# Patient Record
Sex: Female | Born: 1970
Health system: Southern US, Community
[De-identification: ages and names within clinical notes are randomized; demographics above are authoritative.]

## PROBLEM LIST (undated history)

## (undated) DIAGNOSIS — H9319 Tinnitus, unspecified ear: Secondary | ICD-10-CM

## (undated) DIAGNOSIS — I2699 Other pulmonary embolism without acute cor pulmonale: Secondary | ICD-10-CM

## (undated) DIAGNOSIS — F32A Depression, unspecified: Secondary | ICD-10-CM

## (undated) DIAGNOSIS — L409 Psoriasis, unspecified: Secondary | ICD-10-CM

## (undated) HISTORY — DX: Tinnitus, unspecified ear: H93.19

## (undated) HISTORY — PX: TOE SURGERY: SHX1073

## (undated) HISTORY — DX: Depression, unspecified: F32.A

## (undated) HISTORY — DX: Psoriasis, unspecified: L40.9

## (undated) HISTORY — PX: FOOT SURGERY: SHX648

---

## 1998-10-12 ENCOUNTER — Ambulatory Visit (HOSPITAL_COMMUNITY): Admission: RE | Admit: 1998-10-12 | Discharge: 1998-10-12 | Payer: Self-pay | Admitting: *Deleted

## 1998-10-22 ENCOUNTER — Inpatient Hospital Stay (HOSPITAL_COMMUNITY): Admission: AD | Admit: 1998-10-22 | Discharge: 1998-10-22 | Payer: Self-pay | Admitting: *Deleted

## 1999-02-15 ENCOUNTER — Ambulatory Visit (HOSPITAL_COMMUNITY): Admission: RE | Admit: 1999-02-15 | Discharge: 1999-02-15 | Payer: Self-pay | Admitting: *Deleted

## 1999-02-15 ENCOUNTER — Encounter: Payer: Self-pay | Admitting: *Deleted

## 1999-03-10 ENCOUNTER — Ambulatory Visit (HOSPITAL_COMMUNITY): Admission: RE | Admit: 1999-03-10 | Discharge: 1999-03-10 | Payer: Self-pay | Admitting: *Deleted

## 1999-03-10 ENCOUNTER — Encounter: Payer: Self-pay | Admitting: *Deleted

## 1999-03-27 ENCOUNTER — Inpatient Hospital Stay (HOSPITAL_COMMUNITY): Admission: AD | Admit: 1999-03-27 | Discharge: 1999-03-27 | Payer: Self-pay | Admitting: Obstetrics and Gynecology

## 1999-05-17 ENCOUNTER — Inpatient Hospital Stay (HOSPITAL_COMMUNITY): Admission: AD | Admit: 1999-05-17 | Discharge: 1999-05-19 | Payer: Self-pay | Admitting: Obstetrics and Gynecology

## 1999-05-17 ENCOUNTER — Inpatient Hospital Stay (HOSPITAL_COMMUNITY): Admission: AD | Admit: 1999-05-17 | Discharge: 1999-05-17 | Payer: Self-pay | Admitting: *Deleted

## 1999-05-20 ENCOUNTER — Encounter (HOSPITAL_COMMUNITY): Admission: RE | Admit: 1999-05-20 | Discharge: 1999-08-18 | Payer: Self-pay | Admitting: *Deleted

## 2000-01-25 ENCOUNTER — Other Ambulatory Visit: Admission: RE | Admit: 2000-01-25 | Discharge: 2000-01-25 | Payer: Self-pay | Admitting: Obstetrics and Gynecology

## 2000-04-26 ENCOUNTER — Emergency Department (HOSPITAL_COMMUNITY): Admission: EM | Admit: 2000-04-26 | Discharge: 2000-04-26 | Payer: Self-pay | Admitting: Emergency Medicine

## 2000-12-26 ENCOUNTER — Other Ambulatory Visit: Admission: RE | Admit: 2000-12-26 | Discharge: 2000-12-26 | Payer: Self-pay | Admitting: Obstetrics and Gynecology

## 2001-08-21 ENCOUNTER — Inpatient Hospital Stay (HOSPITAL_COMMUNITY): Admission: EM | Admit: 2001-08-21 | Discharge: 2001-08-22 | Payer: Self-pay | Admitting: Emergency Medicine

## 2001-08-21 ENCOUNTER — Encounter: Payer: Self-pay | Admitting: Emergency Medicine

## 2002-06-11 ENCOUNTER — Ambulatory Visit (HOSPITAL_BASED_OUTPATIENT_CLINIC_OR_DEPARTMENT_OTHER): Admission: RE | Admit: 2002-06-11 | Discharge: 2002-06-11 | Payer: Self-pay | Admitting: Family Medicine

## 2002-12-11 ENCOUNTER — Encounter: Payer: Self-pay | Admitting: Orthopedic Surgery

## 2002-12-12 ENCOUNTER — Inpatient Hospital Stay (HOSPITAL_COMMUNITY): Admission: RE | Admit: 2002-12-12 | Discharge: 2002-12-13 | Payer: Self-pay | Admitting: Orthopedic Surgery

## 2003-03-26 ENCOUNTER — Other Ambulatory Visit: Admission: RE | Admit: 2003-03-26 | Discharge: 2003-03-26 | Payer: Self-pay | Admitting: Obstetrics and Gynecology

## 2003-04-22 ENCOUNTER — Ambulatory Visit (HOSPITAL_BASED_OUTPATIENT_CLINIC_OR_DEPARTMENT_OTHER): Admission: RE | Admit: 2003-04-22 | Discharge: 2003-04-22 | Payer: Self-pay | Admitting: Orthopedic Surgery

## 2005-03-25 ENCOUNTER — Other Ambulatory Visit: Admission: RE | Admit: 2005-03-25 | Discharge: 2005-03-25 | Payer: Self-pay | Admitting: Obstetrics and Gynecology

## 2005-03-28 ENCOUNTER — Ambulatory Visit (HOSPITAL_COMMUNITY): Admission: RE | Admit: 2005-03-28 | Discharge: 2005-03-28 | Payer: Self-pay | Admitting: Family Medicine

## 2005-05-09 ENCOUNTER — Ambulatory Visit (HOSPITAL_COMMUNITY): Admission: RE | Admit: 2005-05-09 | Discharge: 2005-05-09 | Payer: Self-pay | Admitting: Endocrinology

## 2006-01-17 ENCOUNTER — Ambulatory Visit (HOSPITAL_COMMUNITY): Admission: RE | Admit: 2006-01-17 | Discharge: 2006-01-17 | Payer: Self-pay | Admitting: General Surgery

## 2006-09-05 ENCOUNTER — Other Ambulatory Visit: Admission: RE | Admit: 2006-09-05 | Discharge: 2006-09-05 | Payer: Self-pay | Admitting: Obstetrics and Gynecology

## 2006-09-12 ENCOUNTER — Ambulatory Visit (HOSPITAL_COMMUNITY): Admission: RE | Admit: 2006-09-12 | Discharge: 2006-09-12 | Payer: Self-pay | Admitting: Family Medicine

## 2006-12-07 ENCOUNTER — Emergency Department (HOSPITAL_COMMUNITY): Admission: EM | Admit: 2006-12-07 | Discharge: 2006-12-07 | Payer: Self-pay | Admitting: Emergency Medicine

## 2010-01-26 ENCOUNTER — Encounter: Admission: RE | Admit: 2010-01-26 | Discharge: 2010-01-26 | Payer: Self-pay | Admitting: Family Medicine

## 2010-08-29 ENCOUNTER — Emergency Department (HOSPITAL_BASED_OUTPATIENT_CLINIC_OR_DEPARTMENT_OTHER)
Admission: EM | Admit: 2010-08-29 | Discharge: 2010-08-29 | Payer: Self-pay | Source: Home / Self Care | Admitting: Emergency Medicine

## 2010-12-25 ENCOUNTER — Encounter
Admission: RE | Admit: 2010-12-25 | Discharge: 2010-12-25 | Payer: Self-pay | Source: Home / Self Care | Attending: Otolaryngology | Admitting: Otolaryngology

## 2010-12-26 ENCOUNTER — Encounter (HOSPITAL_BASED_OUTPATIENT_CLINIC_OR_DEPARTMENT_OTHER): Payer: Self-pay | Admitting: General Surgery

## 2011-02-17 LAB — COMPREHENSIVE METABOLIC PANEL
ALT: 21 U/L (ref 0–35)
AST: 23 U/L (ref 0–37)
Albumin: 4 g/dL (ref 3.5–5.2)
Alkaline Phosphatase: 87 U/L (ref 39–117)
Glucose, Bld: 99 mg/dL (ref 70–99)
Potassium: 4 mEq/L (ref 3.5–5.1)
Sodium: 142 mEq/L (ref 135–145)
Total Bilirubin: 0.6 mg/dL (ref 0.3–1.2)
Total Protein: 7.8 g/dL (ref 6.0–8.3)

## 2011-02-17 LAB — CBC
HCT: 43.1 % (ref 36.0–46.0)
Hemoglobin: 14.1 g/dL (ref 12.0–15.0)
MCHC: 32.6 g/dL (ref 30.0–36.0)
RDW: 13.2 % (ref 11.5–15.5)

## 2011-02-17 LAB — DIFFERENTIAL
Basophils Relative: 1 % (ref 0–1)
Eosinophils Absolute: 0.2 10*3/uL (ref 0.0–0.7)
Eosinophils Relative: 2 % (ref 0–5)
Lymphocytes Relative: 26 % (ref 12–46)
Monocytes Absolute: 0.5 10*3/uL (ref 0.1–1.0)
Neutro Abs: 5.2 10*3/uL (ref 1.7–7.7)

## 2011-02-17 LAB — URINALYSIS, ROUTINE W REFLEX MICROSCOPIC
Bilirubin Urine: NEGATIVE
Nitrite: NEGATIVE
Specific Gravity, Urine: 1.014 (ref 1.005–1.030)
Urobilinogen, UA: 0.2 mg/dL (ref 0.0–1.0)

## 2011-02-17 LAB — LIPASE, BLOOD: Lipase: 68 U/L (ref 23–300)

## 2011-02-17 LAB — PREGNANCY, URINE: Preg Test, Ur: NEGATIVE

## 2011-04-22 NOTE — Op Note (Signed)
   Angela Owen, Angela Owen                        ACCOUNT NO.:  [id number]   MEDICAL RECORD NO.:  [id number]                   PATIENT TYPE:  AMB   LOCATION:  DSC                                  FACILITY:  [facility]   PHYSICIAN:  Leonides Grills, M.D.                  DATE OF BIRTH:  01/01/1971   DATE OF PROCEDURE:  04/22/2003  DATE OF DISCHARGE:                                 OPERATIVE REPORT   PREOPERATIVE DIAGNOSIS:  Complication of hardware, left deep calcaneus.   POSTOPERATIVE DIAGNOSIS:  Complication of hardware, left deep calcaneus.   PROCEDURE:  Hardware removal, deep left calcaneus.   ANESTHESIA:  General endotracheal tube.   SURGEON:  Leonides Grills, M.D.   ASSISTANT:  Lianne Cure, P.A.   ESTIMATED BLOOD LOSS:  Minimal.   TOURNIQUET:  None.   COMPLICATIONS:  None.   DISPOSITION:  Stable to PAR.   INDICATIONS:  This is a 40 year old female who is status post a left  hindfoot reconstructive procedure with symptomatic hardware.  She was  consented for the above procedure.  All risks, which include infection,  nerve or vessel injury, bleeding, wound breakdown, persistent pain,  worsening pain, were all explained, questions were encouraged and answered.   DESCRIPTION OF PROCEDURE:  The patient was brought to the operating room and  placed in supine position after adequate general endotracheal tube  anesthesia was administered as well as Ancef 1 g IV piggyback.  The left  lower extremity was then prepped and draped in a sterile manner.  A  longitudinal incision over the previous incision was then made.  Dissection  was carried down to the screw heads.  The screws were then removed with a  large-fragment screwdriver without difficulty.  All screws were intact, no  evidence of failure.  The wound was copiously irrigated with normal saline.  Skin was closed with 4-0 nylon.  A sterile dressing was applied.  The  patient was taken to the PAR.                     Leonides Grills, M.D.    PB/MEDQ  D:  04/22/2003  T:  04/22/2003  Job:  [id number]

## 2011-04-22 NOTE — Op Note (Signed)
Angela Owen, Angela Owen                        ACCOUNT NO.:  [id number]   MEDICAL RECORD NO.:  [id number]                   PATIENT TYPE:  AMB   LOCATION:  DFTL                                 FACILITY:  [facility]   PHYSICIAN:  Leonides Grills, M.D.                  DATE OF BIRTH:  1971/08/03   DATE OF PROCEDURE:  12/11/2002  DATE OF DISCHARGE:                                 OPERATIVE REPORT   PREOPERATIVE DIAGNOSES:  1. Large grade 2 posterior tibial tendon insufficiency/tendonitis.  2. Left tight Achilles tendon.   POSTOPERATIVE DIAGNOSIS:  1. Large grade 2 posterior tibial tendon insufficiency/tendonitis.  2. Left tight Achilles tendon.   OPERATION:  1. Left lengthening calcaneal osteotomy.  2. Left iliac crest bone graft.  3. Left medializing calcaneal osteotomy.  4. Left percutaneous tendo Achilles lengthening.  5. Left tenosynovectomy, posterior tibial tendon.  6. Left FDL to posterior tibial tendon transfer.   ANESTHESIA:  General endotracheal tube anesthesia.   SURGEON:  Leonides Grills, M.D.   ASSISTANT:  Lianne Cure, P.A.   ESTIMATED BLOOD LOSS:  Minimal.   TOURNIQUET TIME:  1 hour and 10 minutes.   COMPLICATIONS:  None.   DISPOSITION:  Stable to the PR.   INDICATIONS:  This is a 40 year old pleasant female who has had longstanding  left posterior medial ankle pain that has been resistant to conservative  management. She is consented for the above procedure. All risks which  include infection, neurovascular injury, malunion, nonunion, hardware  irritation or hardware failure, DVT, possible PE, stiffness, arthritis,  recurrence of deformity, persistent pain and worsening of pain were all  explained and questions were encouraged and answered.   OPERATION:  The patient was brought to the operating room and placed in the  supine position. After adequate general endotracheal tube anesthesia was  administered as well as 1 gm Ancef IV. The left lower extremity as  well as  the left iliac crest bone graft site were prepped and draped in a sterile  manner with a proximally placed thigh tourniquet.   The procedure commenced with harvesting the iliac crest bone graft. A  longitudinal incision over the left iliac crest was made. Dissection was  carried down through adipose tissue to the crest. Hemostasis was obtained.  The crest was opened in line with the incision. The soft tissues were  elevated off the inner and outer table.   An approximately 12 mm long trapezoidal shaped tricortical bone block was  then obtained with a saggital saw and the aid of a curved 1/4 inch  osteotome. Once this was removed the wound was irrigated copiously with  normal saline. Bone wax was placed in the open bone. Marcaine 0.5% without  epinephrine was instilled in the wound. The wound was closed in layers with  #1 Vicryl deep and 2-0 Vicryl subcu and 4-0 Monocryl subcuticular stitch and  Steri-Strips were applied.  We then performed a percutaneous teno Achilles lengthening with 2 medial and  1 lateral hemisections of the Achilles tendon and this had an excellent  release of the tight Achilles tendon. We then gravity exsanguinated the left  lower extremity and the tourniquet was elevated to 290 mmHg.   We started the procedure with a longitudinal incision over the posterior  tibial tendon. Dissection was carried down through the flexor retinaculum.  The flexor retinaculum was opened in line of the incision. There was a  tremendous amount of synovitis in this area. This was debrided sharply with  the scalpel.   Once all the synovitis was removed, the tendon was inspected. There were no  tears within the tendon. It appeared to be without significant disease, and  there was adequate excursion when pulled on it as well and was not rock  solid.   We then identified the FDL tendon and traced this to _______ and tenotomized  this and left it in the wound for later  transfer. We then went on the  lateral aspect of the foot. A longitudinal incision was made over the neck  of the calcaneous. Dissection was carried down through the soft tissues with  the aid of the scissors.   Once the extensor digitorum brevis was identified, this was then elevated  off the neck of the calcaneous as well as soft tissues plantarly. The  peroneal tendons were protected. With Hohmanns in place and the CC joint  identified, an osteotomy was made approximately 1.2 cm proximal to the CC  joint, perpendicular to the lateral wall of the calcaneous, parallel to the  CC joint with the sagittal saw.   Once this was done we then placed a small lamina spreader in this area,  spread and lengthened the calcaneous. This had an excellent correction of  the arch. We then placed the tricortical bone block, tamping it into place  so that it was flush with both the lateral and superior cortex as well. This  was palpated with the Glorious Peach to be in excellent position. We then placed a 50  mm long, 3.5 mm fully threaded cortical screw across this area from the  anterior process of the calcaneous.   Once this was done we then made a longitudinal incision perpendicular to the  calcaneal  pitch over the calcaneal tuber laterally. Dissection was carried  down to the bone. The soft tissues were elevated both superiorly and  inferiorly. Hohmanns were then placed and elevated.  With a 4000 saw, an  osteotomy was then made. Once the osteotomy was made, the calcaneous was  then transferred a minimum of 5 to 7 mm medially, and this was provisionally  held with a 2 mm K-wire.   A longitudinal incision was then made in the midline glabral skin of the  heel. The dissection was carried down to the bone. Two 6.5 mm, 16 mm  threaded cancellous lag screws were then placed, drilling with a 4-5 and 3-5 drills respectively. This had excellent compression of the osteotomy site.  The K-wire was then removed.    Final x-rays were obtained in the AP and lateral and axial views of the hind  foot. This showed excellent position of the hardware and correction of the  hindfoot deformity.   Once all wounds were copiously irrigated with normal saline. The lateral  wounds as well as the heel wound were closed with 4-0 nylon. We then went  back to the medial wound. The FDL  was then transferred to the posterior  tibial tendon as well as the navicular periosteum using #2 fiber wire. The  wound was copiously irrigated with normal saline medially. The subcu was  closed with 2-0 Vicryl. The skin was closed 4-0 nylon. A sterile dressing  was applied. A Jones dressing was applied with the ankle in  neutral  dorsiflexion. The patient was stable to the PR.                                               Leonides Grills, M.D.    PB/MEDQ  D:  12/11/2002  T:  12/11/2002  Job:  [id number]
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
## 2012-02-29 ENCOUNTER — Other Ambulatory Visit: Payer: Self-pay | Admitting: Physician Assistant

## 2012-02-29 ENCOUNTER — Other Ambulatory Visit (HOSPITAL_COMMUNITY)
Admission: RE | Admit: 2012-02-29 | Discharge: 2012-02-29 | Disposition: A | Discharge status: Home / Self Care | Payer: 59 | Source: Ambulatory Visit | Attending: Family Medicine | Admitting: Family Medicine

## 2012-02-29 DIAGNOSIS — Z0000 Encounter for general adult medical examination without abnormal findings: Secondary | ICD-10-CM | POA: Insufficient documentation

## 2015-04-07 ENCOUNTER — Other Ambulatory Visit: Payer: Self-pay | Admitting: Orthopedic Surgery

## 2015-04-07 DIAGNOSIS — M79671 Pain in right foot: Secondary | ICD-10-CM

## 2015-04-13 ENCOUNTER — Ambulatory Visit
Admission: RE | Admit: 2015-04-13 | Discharge: 2015-04-13 | Disposition: A | Discharge status: Home / Self Care | Payer: 59 | Source: Ambulatory Visit | Attending: Orthopedic Surgery | Admitting: Orthopedic Surgery

## 2015-04-13 DIAGNOSIS — M79671 Pain in right foot: Secondary | ICD-10-CM

## 2015-10-14 ENCOUNTER — Emergency Department (HOSPITAL_COMMUNITY)
Admission: EM | Admit: 2015-10-14 | Discharge: 2015-10-14 | Disposition: A | Discharge status: Home / Self Care | Payer: 59 | Attending: Emergency Medicine | Admitting: Emergency Medicine

## 2015-10-14 ENCOUNTER — Encounter (HOSPITAL_COMMUNITY): Payer: Self-pay | Admitting: Emergency Medicine

## 2015-10-14 DIAGNOSIS — Y998 Other external cause status: Secondary | ICD-10-CM | POA: Insufficient documentation

## 2015-10-14 DIAGNOSIS — M546 Pain in thoracic spine: Secondary | ICD-10-CM

## 2015-10-14 DIAGNOSIS — Y92410 Unspecified street and highway as the place of occurrence of the external cause: Secondary | ICD-10-CM | POA: Insufficient documentation

## 2015-10-14 DIAGNOSIS — Y939 Activity, unspecified: Secondary | ICD-10-CM | POA: Insufficient documentation

## 2015-10-14 DIAGNOSIS — S0990XA Unspecified injury of head, initial encounter: Secondary | ICD-10-CM | POA: Insufficient documentation

## 2015-10-14 DIAGNOSIS — S299XXA Unspecified injury of thorax, initial encounter: Secondary | ICD-10-CM | POA: Insufficient documentation

## 2015-10-14 MED ORDER — METHOCARBAMOL 500 MG PO TABS
750.0000 mg | ORAL_TABLET | Freq: Once | ORAL | Status: AC
  Administered 2015-10-14: 750 mg via ORAL
  Filled 2015-10-14: qty 2

## 2015-10-14 MED ORDER — IBUPROFEN 400 MG PO TABS
800.0000 mg | ORAL_TABLET | Freq: Once | ORAL | Status: AC
  Administered 2015-10-14: 800 mg via ORAL
  Filled 2015-10-14: qty 2

## 2015-10-14 MED ORDER — NAPROXEN 500 MG PO TABS: 500.0000 mg | ORAL_TABLET | Freq: Two times a day (BID) | ORAL | Status: DC
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
## 2015-10-14 NOTE — Discharge Instructions (Signed)
Take your medications as prescribed as needed for pain relief. You may also take your home muscle relaxant prescription as prescribed for additional relief. Follow-up with your primary care provider in one week. Return to the emergency department if symptoms worsen or new onset of fever, numbness, tingling, saddle anesthesia, loss of bowel or bladder, weakness.
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
## 2016-05-04 ENCOUNTER — Other Ambulatory Visit (HOSPITAL_COMMUNITY)
Admission: RE | Admit: 2016-05-04 | Discharge: 2016-05-04 | Disposition: A | Discharge status: Home / Self Care | Payer: 59 | Source: Ambulatory Visit | Attending: Family Medicine | Admitting: Family Medicine

## 2016-05-04 ENCOUNTER — Other Ambulatory Visit: Payer: Self-pay | Admitting: Family Medicine

## 2016-05-04 DIAGNOSIS — Z01419 Encounter for gynecological examination (general) (routine) without abnormal findings: Secondary | ICD-10-CM | POA: Insufficient documentation

## 2016-05-05 LAB — CYTOLOGY - PAP

## 2016-08-09 ENCOUNTER — Encounter (HOSPITAL_COMMUNITY): Payer: Self-pay | Admitting: *Deleted

## 2016-08-09 ENCOUNTER — Emergency Department (HOSPITAL_COMMUNITY)
Admission: EM | Admit: 2016-08-09 | Discharge: 2016-08-09 | Disposition: A | Discharge status: Home / Self Care | Payer: 59 | Attending: Emergency Medicine | Admitting: Emergency Medicine

## 2016-08-09 DIAGNOSIS — Y92410 Unspecified street and highway as the place of occurrence of the external cause: Secondary | ICD-10-CM | POA: Diagnosis not present

## 2016-08-09 DIAGNOSIS — M25552 Pain in left hip: Secondary | ICD-10-CM | POA: Insufficient documentation

## 2016-08-09 DIAGNOSIS — Y939 Activity, unspecified: Secondary | ICD-10-CM | POA: Insufficient documentation

## 2016-08-09 DIAGNOSIS — Y999 Unspecified external cause status: Secondary | ICD-10-CM | POA: Diagnosis not present

## 2016-08-09 MED ORDER — KETOROLAC TROMETHAMINE 30 MG/ML IJ SOLN
30.0000 mg | Freq: Once | INTRAMUSCULAR | Status: AC
  Administered 2016-08-09: 30 mg via INTRAMUSCULAR
  Filled 2016-08-09: qty 1

## 2016-08-09 MED ORDER — PREDNISONE 20 MG PO TABS: 40.0000 mg | ORAL_TABLET | Freq: Every day | ORAL | 0 refills | Status: DC

## 2016-08-09 MED ORDER — HYDROCODONE-ACETAMINOPHEN 5-325 MG PO TABS: 1.0000 | ORAL_TABLET | ORAL | 0 refills | Status: DC | PRN

## 2016-08-09 MED ORDER — PREDNISONE 20 MG PO TABS
40.0000 mg | ORAL_TABLET | Freq: Once | ORAL | Status: AC
  Administered 2016-08-09: 40 mg via ORAL
  Filled 2016-08-09: qty 2

## 2016-08-09 MED ORDER — NAPROXEN 500 MG PO TABS: 500.0000 mg | ORAL_TABLET | Freq: Two times a day (BID) | ORAL | 0 refills | Status: DC

## 2016-08-09 MED ORDER — HYDROCODONE-ACETAMINOPHEN 5-325 MG PO TABS
1.0000 | ORAL_TABLET | Freq: Once | ORAL | Status: AC
  Administered 2016-08-09: 1 via ORAL
  Filled 2016-08-09: qty 1

## 2016-08-09 MED ORDER — METHOCARBAMOL 500 MG PO TABS: 500.0000 mg | ORAL_TABLET | Freq: Two times a day (BID) | ORAL | 0 refills | Status: DC
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
## 2016-08-09 NOTE — Discharge Instructions (Signed)
Take medication as prescribed. Take the prednisone (steroid) and the naproxen at separate times. Follow up with Dr. Alvan Dame as soon as possible. Return to the ER for new or worsening symptoms.
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
## 2017-01-14 DIAGNOSIS — R1084 Generalized abdominal pain: Secondary | ICD-10-CM | POA: Diagnosis not present

## 2017-01-24 DIAGNOSIS — M50121 Cervical disc disorder at C4-C5 level with radiculopathy: Secondary | ICD-10-CM | POA: Diagnosis not present

## 2017-01-24 DIAGNOSIS — M542 Cervicalgia: Secondary | ICD-10-CM | POA: Diagnosis not present

## 2017-02-07 DIAGNOSIS — M79604 Pain in right leg: Secondary | ICD-10-CM | POA: Diagnosis not present

## 2017-02-09 ENCOUNTER — Other Ambulatory Visit: Payer: Self-pay | Admitting: Family Medicine

## 2017-02-09 DIAGNOSIS — M79604 Pain in right leg: Secondary | ICD-10-CM

## 2017-02-13 DIAGNOSIS — M7711 Lateral epicondylitis, right elbow: Secondary | ICD-10-CM | POA: Diagnosis not present

## 2017-02-13 DIAGNOSIS — M25521 Pain in right elbow: Secondary | ICD-10-CM | POA: Diagnosis not present

## 2017-02-13 DIAGNOSIS — M25522 Pain in left elbow: Secondary | ICD-10-CM | POA: Diagnosis not present

## 2017-02-17 DIAGNOSIS — S76012D Strain of muscle, fascia and tendon of left hip, subsequent encounter: Secondary | ICD-10-CM | POA: Diagnosis not present

## 2017-02-17 DIAGNOSIS — M25552 Pain in left hip: Secondary | ICD-10-CM | POA: Diagnosis not present

## 2017-02-20 ENCOUNTER — Ambulatory Visit
Admission: RE | Admit: 2017-02-20 | Discharge: 2017-02-20 | Disposition: A | Discharge status: Home / Self Care | Payer: 59 | Source: Ambulatory Visit | Attending: Family Medicine | Admitting: Family Medicine

## 2017-02-20 DIAGNOSIS — M25522 Pain in left elbow: Secondary | ICD-10-CM | POA: Diagnosis not present

## 2017-02-20 DIAGNOSIS — M25521 Pain in right elbow: Secondary | ICD-10-CM | POA: Diagnosis not present

## 2017-02-20 DIAGNOSIS — M79661 Pain in right lower leg: Secondary | ICD-10-CM | POA: Diagnosis not present

## 2017-02-20 DIAGNOSIS — M79604 Pain in right leg: Secondary | ICD-10-CM

## 2017-03-02 DIAGNOSIS — M25521 Pain in right elbow: Secondary | ICD-10-CM | POA: Diagnosis not present

## 2017-03-02 DIAGNOSIS — M25522 Pain in left elbow: Secondary | ICD-10-CM | POA: Diagnosis not present

## 2017-03-09 DIAGNOSIS — M25522 Pain in left elbow: Secondary | ICD-10-CM | POA: Diagnosis not present

## 2017-03-09 DIAGNOSIS — M25521 Pain in right elbow: Secondary | ICD-10-CM | POA: Diagnosis not present

## 2017-03-16 DIAGNOSIS — M7712 Lateral epicondylitis, left elbow: Secondary | ICD-10-CM | POA: Diagnosis not present

## 2017-03-16 DIAGNOSIS — M7711 Lateral epicondylitis, right elbow: Secondary | ICD-10-CM | POA: Diagnosis not present

## 2017-03-17 DIAGNOSIS — M1612 Unilateral primary osteoarthritis, left hip: Secondary | ICD-10-CM | POA: Diagnosis not present

## 2017-03-17 DIAGNOSIS — M5136 Other intervertebral disc degeneration, lumbar region: Secondary | ICD-10-CM | POA: Diagnosis not present

## 2017-03-17 DIAGNOSIS — M25552 Pain in left hip: Secondary | ICD-10-CM | POA: Diagnosis not present

## 2017-03-17 DIAGNOSIS — M47816 Spondylosis without myelopathy or radiculopathy, lumbar region: Secondary | ICD-10-CM | POA: Diagnosis not present

## 2017-03-20 DIAGNOSIS — R1031 Right lower quadrant pain: Secondary | ICD-10-CM | POA: Diagnosis not present

## 2017-03-21 ENCOUNTER — Other Ambulatory Visit: Payer: Self-pay | Admitting: Psychiatry

## 2017-03-21 DIAGNOSIS — R229 Localized swelling, mass and lump, unspecified: Principal | ICD-10-CM

## 2017-03-21 DIAGNOSIS — IMO0002 Reserved for concepts with insufficient information to code with codable children: Secondary | ICD-10-CM

## 2017-03-22 ENCOUNTER — Other Ambulatory Visit: Payer: Self-pay | Admitting: Physician Assistant

## 2017-03-22 DIAGNOSIS — R1031 Right lower quadrant pain: Secondary | ICD-10-CM

## 2017-03-23 DIAGNOSIS — M7712 Lateral epicondylitis, left elbow: Secondary | ICD-10-CM | POA: Diagnosis not present

## 2017-03-23 DIAGNOSIS — M7711 Lateral epicondylitis, right elbow: Secondary | ICD-10-CM | POA: Diagnosis not present

## 2017-03-31 ENCOUNTER — Other Ambulatory Visit: Payer: 59

## 2017-04-06 ENCOUNTER — Ambulatory Visit
Admission: RE | Admit: 2017-04-06 | Discharge: 2017-04-06 | Disposition: A | Discharge status: Home / Self Care | Payer: 59 | Source: Ambulatory Visit | Attending: Physician Assistant | Admitting: Physician Assistant

## 2017-04-06 DIAGNOSIS — R1031 Right lower quadrant pain: Secondary | ICD-10-CM

## 2017-04-12 DIAGNOSIS — M7711 Lateral epicondylitis, right elbow: Secondary | ICD-10-CM | POA: Diagnosis not present

## 2017-04-12 DIAGNOSIS — M7712 Lateral epicondylitis, left elbow: Secondary | ICD-10-CM | POA: Diagnosis not present

## 2017-04-17 DIAGNOSIS — M542 Cervicalgia: Secondary | ICD-10-CM | POA: Diagnosis not present

## 2017-04-25 DIAGNOSIS — M25552 Pain in left hip: Secondary | ICD-10-CM | POA: Diagnosis not present

## 2017-04-26 DIAGNOSIS — M542 Cervicalgia: Secondary | ICD-10-CM | POA: Diagnosis not present

## 2017-04-28 DIAGNOSIS — M542 Cervicalgia: Secondary | ICD-10-CM | POA: Diagnosis not present

## 2017-05-09 DIAGNOSIS — M542 Cervicalgia: Secondary | ICD-10-CM | POA: Diagnosis not present

## 2017-05-10 DIAGNOSIS — M7711 Lateral epicondylitis, right elbow: Secondary | ICD-10-CM | POA: Diagnosis not present

## 2017-05-10 DIAGNOSIS — M7712 Lateral epicondylitis, left elbow: Secondary | ICD-10-CM | POA: Diagnosis not present

## 2017-05-12 DIAGNOSIS — R1031 Right lower quadrant pain: Secondary | ICD-10-CM | POA: Diagnosis not present

## 2017-05-23 DIAGNOSIS — M25552 Pain in left hip: Secondary | ICD-10-CM | POA: Diagnosis not present

## 2017-05-25 ENCOUNTER — Other Ambulatory Visit: Payer: Self-pay | Admitting: Physician Assistant

## 2017-05-25 DIAGNOSIS — R140 Abdominal distension (gaseous): Secondary | ICD-10-CM

## 2017-05-25 DIAGNOSIS — R1031 Right lower quadrant pain: Secondary | ICD-10-CM

## 2017-06-22 ENCOUNTER — Other Ambulatory Visit: Payer: 59

## 2017-06-27 ENCOUNTER — Ambulatory Visit
Admission: RE | Admit: 2017-06-27 | Discharge: 2017-06-27 | Disposition: A | Discharge status: Home / Self Care | Payer: 59 | Source: Ambulatory Visit | Attending: Physician Assistant | Admitting: Physician Assistant

## 2017-06-27 DIAGNOSIS — R140 Abdominal distension (gaseous): Secondary | ICD-10-CM

## 2017-06-27 DIAGNOSIS — K8020 Calculus of gallbladder without cholecystitis without obstruction: Secondary | ICD-10-CM | POA: Diagnosis not present

## 2017-06-27 DIAGNOSIS — R1031 Right lower quadrant pain: Secondary | ICD-10-CM

## 2017-07-18 DIAGNOSIS — M76892 Other specified enthesopathies of left lower limb, excluding foot: Secondary | ICD-10-CM | POA: Diagnosis not present

## 2017-07-28 DIAGNOSIS — K8020 Calculus of gallbladder without cholecystitis without obstruction: Secondary | ICD-10-CM | POA: Diagnosis not present

## 2017-08-04 DIAGNOSIS — M76892 Other specified enthesopathies of left lower limb, excluding foot: Secondary | ICD-10-CM | POA: Diagnosis not present

## 2017-08-04 DIAGNOSIS — M25552 Pain in left hip: Secondary | ICD-10-CM | POA: Diagnosis not present

## 2017-08-23 DIAGNOSIS — M542 Cervicalgia: Secondary | ICD-10-CM | POA: Diagnosis not present

## 2017-08-23 DIAGNOSIS — R51 Headache: Secondary | ICD-10-CM | POA: Diagnosis not present

## 2017-08-23 DIAGNOSIS — M50121 Cervical disc disorder at C4-C5 level with radiculopathy: Secondary | ICD-10-CM | POA: Diagnosis not present

## 2017-08-30 DIAGNOSIS — Z0189 Encounter for other specified special examinations: Secondary | ICD-10-CM | POA: Diagnosis not present

## 2017-09-04 HISTORY — PX: CHOLECYSTECTOMY: SHX55

## 2017-09-08 ENCOUNTER — Other Ambulatory Visit: Payer: Self-pay | Admitting: General Surgery

## 2017-09-08 DIAGNOSIS — K8010 Calculus of gallbladder with chronic cholecystitis without obstruction: Secondary | ICD-10-CM | POA: Diagnosis not present

## 2017-09-08 DIAGNOSIS — K9172 Accidental puncture and laceration of a digestive system organ or structure during other procedure: Secondary | ICD-10-CM | POA: Diagnosis not present

## 2017-09-20 DIAGNOSIS — R109 Unspecified abdominal pain: Secondary | ICD-10-CM | POA: Diagnosis not present

## 2017-09-27 DIAGNOSIS — M24152 Other articular cartilage disorders, left hip: Secondary | ICD-10-CM | POA: Diagnosis not present

## 2017-09-27 DIAGNOSIS — S73192A Other sprain of left hip, initial encounter: Secondary | ICD-10-CM | POA: Diagnosis not present

## 2017-09-27 DIAGNOSIS — M25552 Pain in left hip: Secondary | ICD-10-CM | POA: Diagnosis not present

## 2017-09-27 DIAGNOSIS — M94252 Chondromalacia, left hip: Secondary | ICD-10-CM | POA: Diagnosis not present

## 2017-09-27 DIAGNOSIS — M76892 Other specified enthesopathies of left lower limb, excluding foot: Secondary | ICD-10-CM | POA: Diagnosis not present

## 2017-09-27 DIAGNOSIS — M25852 Other specified joint disorders, left hip: Secondary | ICD-10-CM | POA: Diagnosis not present

## 2017-09-29 DIAGNOSIS — M25852 Other specified joint disorders, left hip: Secondary | ICD-10-CM | POA: Diagnosis not present

## 2017-09-29 DIAGNOSIS — M76892 Other specified enthesopathies of left lower limb, excluding foot: Secondary | ICD-10-CM | POA: Diagnosis not present

## 2017-09-29 DIAGNOSIS — S73192A Other sprain of left hip, initial encounter: Secondary | ICD-10-CM | POA: Diagnosis not present

## 2017-10-05 HISTORY — PX: HIP SURGERY: SHX245

## 2017-10-06 DIAGNOSIS — M25652 Stiffness of left hip, not elsewhere classified: Secondary | ICD-10-CM | POA: Diagnosis not present

## 2017-10-10 DIAGNOSIS — M25652 Stiffness of left hip, not elsewhere classified: Secondary | ICD-10-CM | POA: Diagnosis not present

## 2017-10-13 DIAGNOSIS — M25652 Stiffness of left hip, not elsewhere classified: Secondary | ICD-10-CM | POA: Diagnosis not present

## 2017-10-16 DIAGNOSIS — M25652 Stiffness of left hip, not elsewhere classified: Secondary | ICD-10-CM | POA: Diagnosis not present

## 2017-10-18 DIAGNOSIS — M25852 Other specified joint disorders, left hip: Secondary | ICD-10-CM | POA: Diagnosis not present

## 2017-10-18 DIAGNOSIS — M76892 Other specified enthesopathies of left lower limb, excluding foot: Secondary | ICD-10-CM | POA: Diagnosis not present

## 2017-10-18 DIAGNOSIS — S73192A Other sprain of left hip, initial encounter: Secondary | ICD-10-CM | POA: Diagnosis not present

## 2017-10-19 DIAGNOSIS — M25652 Stiffness of left hip, not elsewhere classified: Secondary | ICD-10-CM | POA: Diagnosis not present

## 2017-10-23 DIAGNOSIS — M25652 Stiffness of left hip, not elsewhere classified: Secondary | ICD-10-CM | POA: Diagnosis not present

## 2017-10-25 DIAGNOSIS — M25652 Stiffness of left hip, not elsewhere classified: Secondary | ICD-10-CM | POA: Diagnosis not present

## 2017-10-27 ENCOUNTER — Inpatient Hospital Stay (HOSPITAL_COMMUNITY)
Admission: EM | Admit: 2017-10-27 | Discharge: 2017-10-30 | DRG: 176 | Disposition: A | Discharge status: Home / Self Care | Payer: 59 | Attending: Internal Medicine | Admitting: Internal Medicine

## 2017-10-27 ENCOUNTER — Emergency Department (HOSPITAL_COMMUNITY): Payer: 59

## 2017-10-27 ENCOUNTER — Encounter (HOSPITAL_COMMUNITY): Payer: Self-pay

## 2017-10-27 DIAGNOSIS — K219 Gastro-esophageal reflux disease without esophagitis: Secondary | ICD-10-CM | POA: Diagnosis present

## 2017-10-27 DIAGNOSIS — N182 Chronic kidney disease, stage 2 (mild): Secondary | ICD-10-CM | POA: Diagnosis present

## 2017-10-27 DIAGNOSIS — R7989 Other specified abnormal findings of blood chemistry: Secondary | ICD-10-CM | POA: Diagnosis not present

## 2017-10-27 DIAGNOSIS — Z6836 Body mass index (BMI) 36.0-36.9, adult: Secondary | ICD-10-CM

## 2017-10-27 DIAGNOSIS — R079 Chest pain, unspecified: Secondary | ICD-10-CM | POA: Diagnosis not present

## 2017-10-27 DIAGNOSIS — M79605 Pain in left leg: Secondary | ICD-10-CM | POA: Diagnosis not present

## 2017-10-27 DIAGNOSIS — Z9114 Patient's other noncompliance with medication regimen: Secondary | ICD-10-CM

## 2017-10-27 DIAGNOSIS — R0602 Shortness of breath: Secondary | ICD-10-CM | POA: Diagnosis not present

## 2017-10-27 DIAGNOSIS — I82433 Acute embolism and thrombosis of popliteal vein, bilateral: Secondary | ICD-10-CM | POA: Diagnosis present

## 2017-10-27 DIAGNOSIS — Z7982 Long term (current) use of aspirin: Secondary | ICD-10-CM

## 2017-10-27 DIAGNOSIS — Z9049 Acquired absence of other specified parts of digestive tract: Secondary | ICD-10-CM

## 2017-10-27 DIAGNOSIS — I2609 Other pulmonary embolism with acute cor pulmonale: Secondary | ICD-10-CM | POA: Diagnosis not present

## 2017-10-27 DIAGNOSIS — I2699 Other pulmonary embolism without acute cor pulmonale: Secondary | ICD-10-CM | POA: Diagnosis present

## 2017-10-27 DIAGNOSIS — R778 Other specified abnormalities of plasma proteins: Secondary | ICD-10-CM

## 2017-10-27 LAB — I-STAT TROPONIN, ED: Troponin i, poc: 0.13 ng/mL (ref 0.00–0.08)

## 2017-10-27 LAB — BASIC METABOLIC PANEL
ANION GAP: 8 (ref 5–15)
BUN: 11 mg/dL (ref 6–20)
CO2: 26 mmol/L (ref 22–32)
Calcium: 9.4 mg/dL (ref 8.9–10.3)
Chloride: 103 mmol/L (ref 101–111)
Creatinine, Ser: 1.29 mg/dL — ABNORMAL HIGH (ref 0.44–1.00)
GFR, EST AFRICAN AMERICAN: 56 mL/min — AB (ref >60)
GFR, EST NON AFRICAN AMERICAN: 48 mL/min — AB (ref >60)
Glucose, Bld: 101 mg/dL — ABNORMAL HIGH (ref 65–99)
POTASSIUM: 3.6 mmol/L (ref 3.5–5.1)
SODIUM: 137 mmol/L (ref 135–145)

## 2017-10-27 LAB — CBC
HEMATOCRIT: 40.7 % (ref 36.0–46.0)
Hemoglobin: 13.7 g/dL (ref 12.0–15.0)
MCH: 28.4 pg (ref 26.0–34.0)
MCHC: 33.7 g/dL (ref 30.0–36.0)
MCV: 84.3 fL (ref 78.0–100.0)
Platelets: 229 10*3/uL (ref 150–400)
RBC: 4.83 MIL/uL (ref 3.87–5.11)
RDW: 14.1 % (ref 11.5–15.5)
WBC: 7.8 10*3/uL (ref 4.0–10.5)

## 2017-10-27 LAB — I-STAT BETA HCG BLOOD, ED (MC, WL, AP ONLY)

## 2017-10-27 MED ORDER — IOPAMIDOL (ISOVUE-370) INJECTION 76%
INTRAVENOUS | Status: AC
  Filled 2017-10-27: qty 100

## 2017-10-27 MED ORDER — IOPAMIDOL (ISOVUE-370) INJECTION 76%
100.0000 mL | Freq: Once | INTRAVENOUS | Status: AC | PRN
  Administered 2017-10-27: 100 mL via INTRAVENOUS

## 2017-10-27 MED ORDER — HEPARIN (PORCINE) IN NACL 100-0.45 UNIT/ML-% IJ SOLN
1550.0000 [IU]/h | INTRAMUSCULAR | Status: DC
  Administered 2017-10-28: 1550 [IU]/h via INTRAVENOUS
  Filled 2017-10-27: qty 250

## 2017-10-27 MED ORDER — SODIUM CHLORIDE 0.9 % IV BOLUS (SEPSIS)
500.0000 mL | Freq: Once | INTRAVENOUS | Status: AC
  Administered 2017-10-27: 500 mL via INTRAVENOUS

## 2017-10-27 MED ORDER — HEPARIN BOLUS VIA INFUSION
3000.0000 [IU] | Freq: Once | INTRAVENOUS | Status: AC
  Administered 2017-10-28: 3000 [IU] via INTRAVENOUS
  Filled 2017-10-27: qty 3000

## 2017-10-27 NOTE — ED Triage Notes (Signed)
Pt presents with c/o chest pain and left leg pain. Pt reports that she has had 2 surgeries since October 5th. Pt reports that she took a muscle relaxer today as well as some aspirin. Pt reports that she is having lower left leg pain and chest pain and was told to come here to rule out a blood clot.

## 2017-10-27 NOTE — ED Notes (Signed)
Patient in CT

## 2017-10-27 NOTE — ED Provider Notes (Signed)
[facility] DEPT Provider Note   CSN: [id number] Arrival date & time: 10/27/17  2111     History   Chief Complaint Chief Complaint  Patient presents with  . Leg Pain  . Chest Pain    HPI Angela Owen is a 46 y.o. female.  Angela Owen is a 46 y.o. Female who presents to the ED complaining of chest pain and shortness of breath beginning today as well as left leg pain for the past week.  Patient reports she has had about 1 week of increased left calf pain.  She had a recent left hip arthroplasty for a labral tear about 1 month ago.  She reports she has some residual left leg swelling since the surgery. She also reports she recently had a cholecystectomy about a month and a half ago.  She is concerned she might have a blood clot.  She reports today she developed some chest pain this morning.  She took a full strength aspirin earlier today.  She reports substernal chest pain that is nonradiating with associated shortness of breath with exertion.  She reports taking an antacid which helps some with her pain today.  She denies a personal history of MI, DVT or PE.  No exogenous estrogen use.  She does report her father had an MI at the age of 66.  She is not a smoker. She denies fevers, coughing, hemoptysis, abdominal pain, nausea, vomiting, diarrhea, lightheadedness, dizziness, syncope, or rashes.    The history is provided by the patient, medical records and the spouse. No language interpreter was used.  Leg Pain    Chest Pain   Associated symptoms include leg pain and shortness of breath. Pertinent negatives include no abdominal pain, no back pain, no cough, no fever, no headaches, no nausea, no palpitations and no vomiting.    History reviewed. No pertinent past medical history.  There are no active problems to display for this patient.   Past Surgical History:  Procedure Laterality Date  . FOOT SURGERY Left   . TOE SURGERY Right     OB  History    Gravida Para Term Preterm AB Living   1             SAB TAB Ectopic Multiple Live Births                   Home Medications    Prior to Admission medications   Medication Sig Start Date End Date Taking? Authorizing Provider  [facility] ASPIRIN EC 325 MG EC tablet Take 325 mg by mouth 2 (two) times daily. 09/27/17  Yes [provider]  metaxalone (SKELAXIN) 800 MG tablet Take 800 mg by mouth 2 (two) times daily as needed. 02/21/17  Yes [provider]  naproxen (NAPROSYN) 500 MG tablet Take 1 tablet (500 mg total) by mouth 2 (two) times daily. Patient taking differently: Take 500 mg by mouth 2 (two) times daily as needed for mild pain or moderate pain.  10/14/15  Yes Nona Dell, PA-C  ranitidine (ZANTAC) 150 MG tablet Take 150 mg by mouth daily.   Yes [provider]    Family History History reviewed. No pertinent family history.  Social History Social History   Tobacco Use  . Smoking status: Never Smoker  . Smokeless tobacco: Never Used  Substance Use Topics  . Alcohol use: Yes    Comment: opcc  . Drug use: No     Allergies  Patient has no known allergies.   Review of Systems Review of Systems  Constitutional: Negative for chills and fever.  HENT: Negative for congestion and sore throat.   Eyes: Negative for visual disturbance.  Respiratory: Positive for shortness of breath. Negative for cough and wheezing.   Cardiovascular: Positive for chest pain and leg swelling. Negative for palpitations.  Gastrointestinal: Negative for abdominal pain, diarrhea, nausea and vomiting.  Genitourinary: Negative for dysuria.  Musculoskeletal: Positive for arthralgias. Negative for back pain and neck pain.  Skin: Negative for rash.  Neurological: Negative for syncope, light-headedness and headaches.     Physical Exam Updated Vital Signs BP (!) 140/98   Pulse 94   Temp 98.5 F (36.9 C) (Oral)   Resp (!) 21   Ht 5\' 7"  (1.702 m)    Wt 106.6 kg (235 lb)   LMP 09/08/2017 (Approximate) Comment: HCG < 5 10-27-17  SpO2 99%   Breastfeeding? Unknown Comment: often has irregular periods, not unusual to be late  BMI 36.81 kg/m   Physical Exam  Constitutional: She appears well-developed and well-nourished.  Non-toxic appearance. She does not appear ill. No distress.  HENT:  Head: Normocephalic and atraumatic.  Mouth/Throat: Oropharynx is clear and moist.  Eyes: Conjunctivae are normal. Pupils are equal, round, and reactive to light. Right eye exhibits no discharge. Left eye exhibits no discharge.  Neck: Neck supple. No JVD present.  Cardiovascular: Regular rhythm, normal heart sounds and intact distal pulses. Tachycardia present. Exam reveals no gallop and no friction rub.  No murmur heard. Pulses:      Radial pulses are 2+ on the right side, and 2+ on the left side.       Dorsalis pedis pulses are 2+ on the right side, and 2+ on the left side.       Posterior tibial pulses are 2+ on the right side, and 2+ on the left side.  HR 104 during exam.   Pulmonary/Chest: Effort normal and breath sounds normal. No tachypnea. No respiratory distress. She has no decreased breath sounds. She has no wheezes. She has no rales.  Lungs are clear to ascultation bilaterally. Symmetric chest expansion bilaterally. No increased work of breathing. No rales or rhonchi.    Abdominal: Soft. There is no tenderness.  Well healed laparoscopic scars noted to abdomen.   Musculoskeletal:       Right lower leg: Normal.       Left lower leg: She exhibits edema. She exhibits no tenderness.  Mild edema noted to left leg without calf TTP. No overlying skin changes. No palpable cords.   Lymphadenopathy:    She has no cervical adenopathy.  Neurological: She is alert. Coordination normal.  Skin: Skin is warm and dry. Capillary refill takes less than 2 seconds. No rash noted. She is not diaphoretic. No erythema. No pallor.  Psychiatric: She has a normal mood  and affect. Her behavior is normal.  Nursing note and vitals reviewed.    ED Treatments / Results  Labs (all labs ordered are listed, but only abnormal results are displayed) Labs Reviewed  BASIC METABOLIC PANEL - Abnormal; Notable for the following components:      Result Value   Glucose, Bld 101 (*)    Creatinine, Ser 1.29 (*)    GFR calc non Af Amer 48 (*)    GFR calc Af Amer 56 (*)    All other components within normal limits  I-STAT TROPONIN, ED - Abnormal; Notable for the following components:  Troponin i, poc 0.13 (*)    All other components within normal limits  CBC  PROTIME-INR  APTT  CBC  HEPARIN LEVEL (UNFRACTIONATED)  I-STAT BETA HCG BLOOD, ED ([facility], [facility], [facility] ONLY)    EKG  EKG Interpretation  Date/Time:  Friday October 27 2017 21:23:54 EST Ventricular Rate:  111 PR Interval:    QRS Duration: 85 QT Interval:  328 QTC Calculation: 446 R Axis:   86 Text Interpretation:  Sinus tachycardia Low voltage, precordial leads Baseline wander in lead(s) V5 When compared with ECG of 12/07/2006, No significant change was found Confirmed by Delora Fuel ([id number]) on 10/27/2017 10:59:35 PM       Radiology Dg Chest 2 View  Result Date: 10/27/2017 CLINICAL DATA:  46 year old female with chest pain and increasing shortness of breath since this morning. EXAM: CHEST  2 VIEW COMPARISON:  12/07/2006 FINDINGS: The heart size and mediastinal contours are within normal limits. Both lungs are clear. The visualized skeletal structures are unremarkable. IMPRESSION: No active cardiopulmonary disease. Electronically Signed   By: Kristopher Oppenheim M.D.   On: 10/27/2017 21:46   Ct Angio Chest Pe W/cm &/or Wo Cm  Result Date: 10/28/2017 CLINICAL DATA:  Chest pain and left leg pain. 2 surgeries since October 5th. EXAM: CT ANGIOGRAPHY CHEST WITH CONTRAST TECHNIQUE: Multidetector CT imaging of the chest was performed using the standard protocol during bolus administration of intravenous contrast.  Multiplanar CT image reconstructions and MIPs were obtained to evaluate the vascular anatomy. CONTRAST:  139mL ISOVUE-370 IOPAMIDOL (ISOVUE-370) INJECTION 76% COMPARISON:  None. FINDINGS: Cardiovascular: Good opacification of the central and segmental pulmonary artery's. Multiple filling defects are demonstrated in the distal right main pulmonary artery, bilateral upper lobe pulmonary arteries, and bilateral lower lobe pulmonary arteries. Appearance is consistent with multiple acute pulmonary emboli. RV to LV ratio is 1.4. No contrast reflux into the hepatic veins. Normal heart size. No pericardial effusion. Normal caliber thoracic aorta. Great vessel origins are patent. Mediastinum/Nodes: Esophagus is decompressed. No significant lymphadenopathy in the chest. Lungs/Pleura: Lungs are clear and expanded. No focal consolidation or airspace disease. No pleural effusions. No pneumothorax. Airways are patent. Upper Abdomen: No acute abnormality. Musculoskeletal: No chest wall abnormality. No acute or significant osseous findings. Review of the MIP images confirms the above findings. IMPRESSION: Multiple bilateral acute pulmonary emboli. Positive for acute PE with CT evidence of right heart strain (RV/LV Ratio = 1.4) consistent with at least submassive (intermediate risk) PE. The presence of right heart strain has been associated with an increased risk of morbidity and mortality. Please activate Code PE by paging [phone]. These results were called by telephone at the time of interpretation on 10/28/2017 at 12:17 am to Dr. Waynetta Pean , who verbally acknowledged these results. Electronically Signed   By: Lucienne Capers M.D.   On: 10/28/2017 00:19    Procedures Procedures (including critical care time)  CRITICAL CARE Performed by: Hanley Hays   Total critical care time: 50  minutes  Critical care time was exclusive of separately billable procedures and treating other patients.  Critical  care was necessary to treat or prevent imminent or life-threatening deterioration.  Critical care was time spent personally by me on the following activities: development of treatment plan with patient and/or surrogate as well as nursing, discussions with consultants, evaluation of patient's response to treatment, examination of patient, obtaining history from patient or surrogate, ordering and performing treatments and interventions, ordering and review of laboratory studies, ordering and review of radiographic studies, pulse  oximetry and re-evaluation of patient's condition.   Medications Ordered in ED Medications  heparin ADULT infusion 100 units/mL (25000 units/278mL sodium chloride 0.45%) (1,550 Units/hr Intravenous New Bag/Given 10/28/17 0006)  iopamidol (ISOVUE-370) 76 % injection (not administered)  sodium chloride 0.9 % bolus 500 mL (500 mLs Intravenous New Bag/Given 10/27/17 2340)  iopamidol (ISOVUE-370) 76 % injection 100 mL (100 mLs Intravenous Contrast Given 10/27/17 2351)  heparin bolus via infusion 3,000 Units (3,000 Units Intravenous Bolus from Bag 10/28/17 0008)  fentaNYL (SUBLIMAZE) injection 50 mcg (50 mcg Intravenous Given 10/28/17 0025)     Initial Impression / Assessment and Plan / ED Course  I have reviewed the triage vital signs and the nursing notes.  Pertinent labs & imaging results that were available during my care of the patient were reviewed by me and considered in my medical decision making (see chart for details).    This  is a 46 y.o. Female who presents to the ED complaining of chest pain and shortness of breath beginning today as well as left leg pain for the past week.  Patient reports she has had about 1 week of increased left calf pain.  She had a recent left hip arthroplasty for a labral tear about 1 month ago.  She reports she has some residual left leg swelling since the surgery. She also reports she recently had a cholecystectomy about a month and a half  ago.  She is concerned she might have a blood clot.  She reports today she developed some chest pain this morning.  She took a full strength aspirin earlier today.  She reports substernal chest pain that is nonradiating with associated shortness of breath with exertion. On exam the patient is afebrile and non-toxic appearing. She is mildly tachycardic on arrival and on my exam with a HR of 104.  No tachypnea or hypoxia on my exam. EKG shows a sinus tachycardia without significant change from her last tracing.  No evidence of STEMI. Blood work reveals a mildly elevated troponin is 0.13.  I suspect this is related to pulmonary embolism.  Blood work is remarkable for creatinine of 1.29.  No recent blood work to compare.  CBC is unremarkable. With this elevation of troponin heparin was started prior to CT angiogram resulting.  Discussed starting heparin with the patient and its risks.  Patient agrees with plan to start heparin.  Patient is awaiting CT angiogram of her chest. I advised she is to be on bedrest and needs to stay on the monitor. She agrees. I also informed nursing staff.   CT angiogram of her chest shows multiple bilateral acute pulmonary emboli.  There is CT evidence of right heart strain. Code PE was activated. Patient already on heparin drip. Patient agrees with plan for admission.  I discussed test results.  At reevaluation her heart rate is between 94 and 102 on my exam.  She is not hypoxic or tachypneic.  I consulted with CCM Dr. Jimmy Footman. She feels the patient can be admitted by medicine and she would like them to order an echo.   I consulted with [facility] hospitalist Dr. Lisabeth Devoid who accepted the patient for admission.   This patient was discussed with Dr. Tomi Bamberger who agrees with assessment and plan.   Final Clinical Impressions(s) / ED Diagnoses   Final diagnoses:  Other acute pulmonary embolism with acute cor pulmonale (HCC)  Left leg pain  Elevated troponin    ED Discharge Orders      None  Waynetta Pean, PA-C 10/28/17 [id number]    Dorie Rank, MD 10/30/17 [phone]

## 2017-10-27 NOTE — ED Notes (Signed)
Troponin= 0.13 RN and Provider notified

## 2017-10-28 ENCOUNTER — Inpatient Hospital Stay (HOSPITAL_COMMUNITY): Payer: 59

## 2017-10-28 ENCOUNTER — Other Ambulatory Visit: Payer: Self-pay

## 2017-10-28 DIAGNOSIS — I361 Nonrheumatic tricuspid (valve) insufficiency: Secondary | ICD-10-CM

## 2017-10-28 DIAGNOSIS — N182 Chronic kidney disease, stage 2 (mild): Secondary | ICD-10-CM

## 2017-10-28 DIAGNOSIS — Z6836 Body mass index (BMI) 36.0-36.9, adult: Secondary | ICD-10-CM | POA: Diagnosis not present

## 2017-10-28 DIAGNOSIS — R748 Abnormal levels of other serum enzymes: Secondary | ICD-10-CM | POA: Diagnosis not present

## 2017-10-28 DIAGNOSIS — M79605 Pain in left leg: Secondary | ICD-10-CM | POA: Diagnosis not present

## 2017-10-28 DIAGNOSIS — I82409 Acute embolism and thrombosis of unspecified deep veins of unspecified lower extremity: Secondary | ICD-10-CM | POA: Diagnosis not present

## 2017-10-28 DIAGNOSIS — I2699 Other pulmonary embolism without acute cor pulmonale: Secondary | ICD-10-CM | POA: Diagnosis not present

## 2017-10-28 DIAGNOSIS — I2609 Other pulmonary embolism with acute cor pulmonale: Secondary | ICD-10-CM | POA: Diagnosis not present

## 2017-10-28 DIAGNOSIS — I82403 Acute embolism and thrombosis of unspecified deep veins of lower extremity, bilateral: Secondary | ICD-10-CM

## 2017-10-28 DIAGNOSIS — I82433 Acute embolism and thrombosis of popliteal vein, bilateral: Secondary | ICD-10-CM | POA: Diagnosis not present

## 2017-10-28 DIAGNOSIS — Z9114 Patient's other noncompliance with medication regimen: Secondary | ICD-10-CM | POA: Diagnosis not present

## 2017-10-28 DIAGNOSIS — K219 Gastro-esophageal reflux disease without esophagitis: Secondary | ICD-10-CM | POA: Diagnosis present

## 2017-10-28 DIAGNOSIS — Z7982 Long term (current) use of aspirin: Secondary | ICD-10-CM | POA: Diagnosis not present

## 2017-10-28 DIAGNOSIS — Z9049 Acquired absence of other specified parts of digestive tract: Secondary | ICD-10-CM | POA: Diagnosis not present

## 2017-10-28 LAB — PROTIME-INR
INR: 1.16
Prothrombin Time: 14.8 seconds (ref 11.4–15.2)

## 2017-10-28 LAB — COMPREHENSIVE METABOLIC PANEL
ALT: 15 U/L (ref 14–54)
ANION GAP: 8 (ref 5–15)
AST: 19 U/L (ref 15–41)
Albumin: 3.5 g/dL (ref 3.5–5.0)
Alkaline Phosphatase: 69 U/L (ref 38–126)
BUN: 11 mg/dL (ref 6–20)
CHLORIDE: 105 mmol/L (ref 101–111)
CO2: 25 mmol/L (ref 22–32)
Calcium: 8.7 mg/dL — ABNORMAL LOW (ref 8.9–10.3)
Creatinine, Ser: 1.13 mg/dL — ABNORMAL HIGH (ref 0.44–1.00)
GFR, EST NON AFRICAN AMERICAN: 57 mL/min — AB (ref >60)
Glucose, Bld: 100 mg/dL — ABNORMAL HIGH (ref 65–99)
POTASSIUM: 3.6 mmol/L (ref 3.5–5.1)
SODIUM: 138 mmol/L (ref 135–145)
Total Bilirubin: 0.6 mg/dL (ref 0.3–1.2)
Total Protein: 6.7 g/dL (ref 6.5–8.1)

## 2017-10-28 LAB — URINALYSIS, ROUTINE W REFLEX MICROSCOPIC
BILIRUBIN URINE: NEGATIVE
Glucose, UA: NEGATIVE mg/dL
Hgb urine dipstick: NEGATIVE
KETONES UR: NEGATIVE mg/dL
LEUKOCYTES UA: NEGATIVE
NITRITE: NEGATIVE
PH: 7 (ref 5.0–8.0)
PROTEIN: NEGATIVE mg/dL
Specific Gravity, Urine: 1.019 (ref 1.005–1.030)

## 2017-10-28 LAB — CBC
HCT: 38 % (ref 36.0–46.0)
Hemoglobin: 12.7 g/dL (ref 12.0–15.0)
MCH: 28 pg (ref 26.0–34.0)
MCHC: 33.4 g/dL (ref 30.0–36.0)
MCV: 83.7 fL (ref 78.0–100.0)
PLATELETS: 211 10*3/uL (ref 150–400)
RBC: 4.54 MIL/uL (ref 3.87–5.11)
RDW: 14.1 % (ref 11.5–15.5)
WBC: 7.6 10*3/uL (ref 4.0–10.5)

## 2017-10-28 LAB — ECHOCARDIOGRAM COMPLETE
HEIGHTINCHES: 67 in
Weight: 3760 oz

## 2017-10-28 LAB — TROPONIN I
TROPONIN I: 0.3 ng/mL — AB (ref <0.03)
TROPONIN I: 0.17 ng/mL — AB (ref <0.03)
Troponin I: 0.15 ng/mL (ref <0.03)

## 2017-10-28 LAB — HEPARIN LEVEL (UNFRACTIONATED)
Heparin Unfractionated: 1.01 IU/mL — ABNORMAL HIGH (ref 0.30–0.70)
Heparin Unfractionated: 0.88 IU/mL — ABNORMAL HIGH (ref 0.30–0.70)

## 2017-10-28 LAB — APTT: APTT: 28 s (ref 24–36)

## 2017-10-28 MED ORDER — FAMOTIDINE 20 MG PO TABS
10.0000 mg | ORAL_TABLET | Freq: Every day | ORAL | Status: DC
  Administered 2017-10-28 – 2017-10-30 (×3): 10 mg via ORAL
  Filled 2017-10-28 (×3): qty 1

## 2017-10-28 MED ORDER — FENTANYL CITRATE (PF) 100 MCG/2ML IJ SOLN
50.0000 ug | Freq: Once | INTRAMUSCULAR | Status: AC
  Administered 2017-10-28: 50 ug via INTRAVENOUS
  Filled 2017-10-28: qty 2

## 2017-10-28 MED ORDER — HEPARIN (PORCINE) IN NACL 100-0.45 UNIT/ML-% IJ SOLN
1330.0000 [IU]/h | INTRAMUSCULAR | Status: DC
  Administered 2017-10-28: 1350 [IU]/h via INTRAVENOUS
  Filled 2017-10-28: qty 250

## 2017-10-28 MED ORDER — ENSURE ENLIVE PO LIQD: 237.0000 mL | Freq: Two times a day (BID) | ORAL | Status: DC

## 2017-10-28 MED ORDER — SODIUM CHLORIDE 0.9 % IV SOLN
INTRAVENOUS | Status: DC
  Administered 2017-10-28: 06:00:00 via INTRAVENOUS

## 2017-10-28 MED ORDER — ONDANSETRON HCL 4 MG/2ML IJ SOLN: 4.0000 mg | Freq: Four times a day (QID) | INTRAMUSCULAR | Status: DC | PRN

## 2017-10-28 MED ORDER — METAXALONE 800 MG PO TABS
800.0000 mg | ORAL_TABLET | Freq: Two times a day (BID) | ORAL | Status: DC | PRN
  Filled 2017-10-28: qty 1

## 2017-10-28 MED ORDER — ONDANSETRON HCL 4 MG PO TABS: 4.0000 mg | ORAL_TABLET | Freq: Four times a day (QID) | ORAL | Status: DC | PRN

## 2017-10-28 MED ORDER — HEPARIN (PORCINE) IN NACL 100-0.45 UNIT/ML-% IJ SOLN
1150.0000 [IU]/h | INTRAMUSCULAR | Status: DC
  Administered 2017-10-29 – 2017-10-30 (×2): 1150 [IU]/h via INTRAVENOUS
  Filled 2017-10-28 (×3): qty 250

## 2017-10-28 MED ORDER — CLONAZEPAM 0.5 MG PO TABS
0.2500 mg | ORAL_TABLET | Freq: Two times a day (BID) | ORAL | Status: DC | PRN
  Administered 2017-10-28 – 2017-10-29 (×3): 0.25 mg via ORAL
  Filled 2017-10-28 (×3): qty 1

## 2017-10-28 MED ORDER — HYDROMORPHONE HCL 1 MG/ML IJ SOLN: 0.5000 mg | INTRAMUSCULAR | Status: DC | PRN

## 2017-10-28 NOTE — Progress Notes (Signed)
CRITICAL VALUE ALERT  Critical Value:  Troponin  Date & Time Notied:  10/28/17 0810  Provider Notified: XU  Orders Received/Actions taken: none at this time

## 2017-10-28 NOTE — Progress Notes (Signed)
ANTICOAGULATION CONSULT NOTE - Initial Consult  Pharmacy Consult for Heparin Indication: pulmonary embolus  No Known Allergies  Patient Measurements: Height: 5\' 7"  (170.2 cm) Weight: 235 lb (106.6 kg) IBW/kg (Calculated) : 61.6 Heparin Dosing Weight:   Vital Signs: Temp: 98.5 F (36.9 C) (11/23 2117) Temp Source: Oral (11/23 2117) BP: 141/105 (11/24 0200) Pulse Rate: 85 (11/24 0200)  Labs: Recent Labs    10/27/17 2210 10/27/17 2332  HGB 13.7  --   HCT 40.7  --   PLT 229  --   APTT  --  28  LABPROT  --  14.8  INR  --  1.16  CREATININE 1.29*  --     Estimated Creatinine Clearance: 67 mL/min (A) (by C-G formula based on SCr of 1.29 mg/dL (H)).   Medical History: History reviewed. No pertinent past medical history.  Medications:  Infusions:  . heparin 1,550 Units/hr (10/28/17 0006)    Assessment: Patient with new PE.  No oral anticoagulants noted on med rec. Baseline coags WNL.  Goal of Therapy:  Heparin level 0.3-0.7 units/ml Monitor platelets by anticoagulation protocol: Yes   Plan:  Heparin bolus 3000 units iv x1 Heparin drip at 1550 units/hr Daily CBC Next heparin level at 0800    Angela Owen, Angela Owen 10/28/2017,2:21 AM

## 2017-10-28 NOTE — Progress Notes (Signed)
PROGRESS NOTE  Angela Owen KVQ:[id number] DOB: 27-Jan-1971 DOA: 10/27/2017 PCP: Angela Ada, MD  HPI/Recap of past 24 hours:  Feeling better, less pleuritic chest pain , no cough, no sob at rest, on room air, no fever  Left leg edema, no pain  Assessment/Plan: Active Problems:   PE (pulmonary thromboembolism) ([place])  Acute bilateral DVT (L>R), submassive PE With recent elective gallbladder surgery on 10/5 and elective left hip surgery on 10/24 Echo "Right ventricle:  The cavity size was mildly dilated. Wall thickness was normal. Systolic function was normal." Continue heparin drip, plan to transition to eliquis tomorrow  Mild elevated troponin, likely from PE troponin peaked at 0.3, trending down.  Echo lvef wnl  CKDII ua unremarkable  Renal dosing meds  Depression /anxiety: labile mood, denies si/hi Prn klonipin  Obesity: Body mass index is 36.81 kg/m.   Code Status: full  Family Communication: patient   Disposition Plan: home in 1-2 days   Consultants:  none  Procedures:  none  Antibiotics:  none   Objective: BP 117/74 (BP Location: Left Arm)   Pulse 97   Temp 99 F (37.2 C) (Oral)   Resp 20   Ht 5\' 7"  (1.702 m)   Wt 106.6 kg (235 lb)   LMP 09/08/2017 (Approximate) Comment: HCG < 5 10-27-17  SpO2 98%   Breastfeeding? Unknown Comment: often has irregular periods, not unusual to be late  BMI 36.81 kg/m   Intake/Output Summary (Last 24 hours) at 10/28/2017 1930 Last data filed at 10/28/2017 1800 Gross per 24 hour  Intake 1383.1 ml  Output 600 ml  Net 783.1 ml   Filed Weights   10/27/17 2121  Weight: 106.6 kg (235 lb)    Exam: Patient is examined daily including today on 10/28/2017, exams remain the same as of yesterday except that has changed    General:  NAD  Cardiovascular: RRR  Respiratory: CTABL  Abdomen: Soft/ND/NT, positive BS  Musculoskeletal: left lower leg Edema, nontender, left hip brace in  place  Neuro: alert, oriented   Data Reviewed: Basic Metabolic Panel: Recent Labs  Lab 10/27/17 2210 10/28/17 0609  NA 137 138  K 3.6 3.6  CL 103 105  CO2 26 25  GLUCOSE 101* 100*  BUN 11 11  CREATININE 1.29* 1.13*  CALCIUM 9.4 8.7*   Liver Function Tests: Recent Labs  Lab 10/28/17 0609  AST 19  ALT 15  ALKPHOS 69  BILITOT 0.6  PROT 6.7  ALBUMIN 3.5   No results for input(s): LIPASE, AMYLASE in the last 168 hours. No results for input(s): AMMONIA in the last 168 hours. CBC: Recent Labs  Lab 10/27/17 2210 10/28/17 0609  WBC 7.8 7.6  HGB 13.7 12.7  HCT 40.7 38.0  MCV 84.3 83.7  PLT 229 211   Cardiac Enzymes:   Recent Labs  Lab 10/28/17 0609 10/28/17 1236 10/28/17 1703  TROPONINI 0.30* 0.17* 0.15*   BNP (last 3 results) No results for input(s): BNP in the last 8760 hours.  ProBNP (last 3 results) No results for input(s): PROBNP in the last 8760 hours.  CBG: No results for input(s): GLUCAP in the last 168 hours.  No results found for this or any previous visit (from the past 240 hour(s)).   Studies: Dg Chest 2 View  Result Date: 10/27/2017 CLINICAL DATA:  46 year old female with chest pain and increasing shortness of breath since this morning. EXAM: CHEST  2 VIEW COMPARISON:  12/07/2006 FINDINGS: The heart size and mediastinal contours are  within normal limits. Both lungs are clear. The visualized skeletal structures are unremarkable. IMPRESSION: No active cardiopulmonary disease. Electronically Signed   By: Kristopher Oppenheim M.D.   On: 10/27/2017 21:46   Ct Angio Chest Pe W/cm &/or Wo Cm  Result Date: 10/28/2017 CLINICAL DATA:  Chest pain and left leg pain. 2 surgeries since October 5th. EXAM: CT ANGIOGRAPHY CHEST WITH CONTRAST TECHNIQUE: Multidetector CT imaging of the chest was performed using the standard protocol during bolus administration of intravenous contrast. Multiplanar CT image reconstructions and MIPs were obtained to evaluate the vascular  anatomy. CONTRAST:  127mL ISOVUE-370 IOPAMIDOL (ISOVUE-370) INJECTION 76% COMPARISON:  None. FINDINGS: Cardiovascular: Good opacification of the central and segmental pulmonary artery's. Multiple filling defects are demonstrated in the distal right main pulmonary artery, bilateral upper lobe pulmonary arteries, and bilateral lower lobe pulmonary arteries. Appearance is consistent with multiple acute pulmonary emboli. RV to LV ratio is 1.4. No contrast reflux into the hepatic veins. Normal heart size. No pericardial effusion. Normal caliber thoracic aorta. Great vessel origins are patent. Mediastinum/Nodes: Esophagus is decompressed. No significant lymphadenopathy in the chest. Lungs/Pleura: Lungs are clear and expanded. No focal consolidation or airspace disease. No pleural effusions. No pneumothorax. Airways are patent. Upper Abdomen: No acute abnormality. Musculoskeletal: No chest wall abnormality. No acute or significant osseous findings. Review of the MIP images confirms the above findings. IMPRESSION: Multiple bilateral acute pulmonary emboli. Positive for acute PE with CT evidence of right heart strain (RV/LV Ratio = 1.4) consistent with at least submassive (intermediate risk) PE. The presence of right heart strain has been associated with an increased risk of morbidity and mortality. Please activate Code PE by paging [phone]. These results were called by telephone at the time of interpretation on 10/28/2017 at 12:17 am to Dr. Waynetta Pean , who verbally acknowledged these results. Electronically Signed   By: Lucienne Capers M.D.   On: 10/28/2017 00:19    Scheduled Meds: . famotidine  10 mg Oral Daily  . feeding supplement (ENSURE ENLIVE)  237 mL Oral BID BM    Continuous Infusions: . sodium chloride 10 mL/hr at 10/28/17 0607  . heparin 1,150 Units/hr (10/28/17 1747)     Time spent: 55mins from 11am to 11:35am, case discussed with vascular surgery Dr Scot Dock.  I have personally reviewed  and interpreted on  10/28/2017 daily labs, tele strips, imagings as discussed above under date review session and assessment and plans.  I reviewed all nursing notes, pharmacy notes,  vitals, pertinent old records  I have discussed plan of care as described above with RN , patient on 10/28/2017   Florencia Reasons MD, PhD  [facility] Pager [phone]. If 7PM-7AM, please contact night-coverage at [email or web address], password [facility] 10/28/2017, 7:30 PM  LOS: 0 days

## 2017-10-28 NOTE — Progress Notes (Signed)
  Echocardiogram 2D Echocardiogram has been performed.  Angela Owen 10/28/2017, 10:18 AM

## 2017-10-28 NOTE — Progress Notes (Signed)
Bilateral lower extremity venous duplex completed. Positive small right popliteal DVT. Left positive for a more extensive DVT coursing through the posterior tibial vein through the popliteal vein. Unable to visualize the peronel vein well enough to evaluate. [facility], [place] 10/28/2017, 11:07 am

## 2017-10-28 NOTE — H&P (Addendum)
TRH H&P    Patient Demographics:    Angela Owen, is a 46 y.o. female  MRN: [id number]  DOB - Oct 31, 1971  Admit Date - 10/27/2017    Outpatient Primary MD for the patient is Carol Ada, MD  Patient coming from: Home  Chief Complaint  Patient presents with  . Leg Pain  . Chest Pain      HPI:    Angela Owen  is a 46 y.o. female,.. recent surgeries including cholecystectomy, left hip arthroscopy with labral debridement, who has been walking with the help of crutches, though she has not been as mobile as before the surgeries.  Patient came to hospital because she started having shortness of breath as well as leg pain for past 1 week.  Patient has been taking aspirin twice a day as prescribed by orthopedics though she says that she has not been very compliant with this medication. No history of oral contraceptive use, no recent long distance travel, denies tobacco abuse,  No family history of coagulation disorder.  The ED she was found to have multiple bilateral acute coronary emboli.  Positive for acute PE with CT evidence of right heart strain consistent with likely submassive PE.  Code PE was initiated, and Dr. Jimmy Footman  was consulted by ED physician,  who recommended admission to hospitalist service and obtain echo in a.m.  Patient started on IV heparin.  She also had mild elevation of troponin 0.13.  She denies nausea vomiting or diarrhea. Denies dysuria, urgency or frequency of urination. Denies fever or chills.     Review of systems:      All other systems reviewed and are negative.   With Past History of the following :    History reviewed. No pertinent past medical history.    Past Surgical History:  Procedure Laterality Date  . FOOT SURGERY Left   . TOE SURGERY Right       Social History:      Social History   Tobacco Use  . Smoking status: Never Smoker  .  Smokeless tobacco: Never Used  Substance Use Topics  . Alcohol use: Yes    Comment: [facility]cc       Family History :   No family history of clotting disorder, no history of cancer   Home Medications:   Prior to Admission medications   Medication Sig Start Date End Date Taking? Authorizing Provider  [facility] ASPIRIN EC 325 MG EC tablet Take 325 mg by mouth 2 (two) times daily. 09/27/17  Yes [provider]  metaxalone (SKELAXIN) 800 MG tablet Take 800 mg by mouth 2 (two) times daily as needed. 02/21/17  Yes [provider]  naproxen (NAPROSYN) 500 MG tablet Take 1 tablet (500 mg total) by mouth 2 (two) times daily. Patient taking differently: Take 500 mg by mouth 2 (two) times daily as needed for mild pain or moderate pain.  10/14/15  Yes Nona Dell, PA-C  ranitidine (ZANTAC) 150 MG tablet Take 150 mg by mouth daily.   Yes [provider]  Allergies:    No Known Allergies   Physical Exam:   Vitals  Blood pressure (!) 143/104, pulse 99, temperature 98.5 F (36.9 C), temperature source Oral, resp. rate 13, height 5\' 7"  (1.702 m), weight 106.6 kg (235 lb), last menstrual period 09/08/2017, SpO2 95 %, unknown if currently breastfeeding.  1.  General: Appears in no acute distress  2. Psychiatric:  Intact judgement and  insight, awake alert, oriented x 3.  3. Neurologic: No focal neurological deficits, all cranial nerves intact.Strength 5/5 all 4 extremities, sensation intact all 4 extremities, plantars down going.  4. Eyes :  anicteric sclerae, moist conjunctivae with no lid lag. PERRLA.  5. ENMT:  Oropharynx clear with moist mucous membranes and good dentition  6. Neck:  supple, no cervical lymphadenopathy appriciated, No thyromegaly  7. Respiratory : Normal respiratory effort, good air movement bilaterally,clear to  auscultation bilaterally  8. Cardiovascular : RRR, no gallops, rubs or murmurs, no leg edema  9. Gastrointestinal:    Positive bowel sounds, abdomen soft, non-tender to palpation,no hepatosplenomegaly, no rigidity or guarding       10. Skin:  No cyanosis, normal texture and turgor, no rash, lesions or ulcers  11.Musculoskeletal:  Good muscle tone,  joints appear normal , no effusions,  normal range of motion    Data Review:    CBC Recent Labs  Lab 10/27/17 2210  WBC 7.8  HGB 13.7  HCT 40.7  PLT 229  MCV 84.3  MCH 28.4  MCHC 33.7  RDW 14.1   ------------------------------------------------------------------------------------------------------------------  Chemistries  Recent Labs  Lab 10/27/17 2210  NA 137  K 3.6  CL 103  CO2 26  GLUCOSE 101*  BUN 11  CREATININE 1.29*  CALCIUM 9.4   ------------------------------------------------------------------------------------------------------------------  ------------------------------------------------------------------------------------------------------------------ GFR: Estimated Creatinine Clearance: 67 mL/min (A) (by C-G formula based on SCr of 1.29 mg/dL (H)). Liver Function Tests: No results for input(s): AST, ALT, ALKPHOS, BILITOT, PROT, ALBUMIN in the last 168 hours. No results for input(s): LIPASE, AMYLASE in the last 168 hours. No results for input(s): AMMONIA in the last 168 hours. Coagulation Profile: Recent Labs  Lab 10/27/17 2332  INR 1.16     --------------------------------------------------------------------------------------------------------------- Urine analysis:    Component Value Date/Time   COLORURINE YELLOW 08/29/2010 2007   APPEARANCEUR CLEAR 08/29/2010 2007   LABSPEC 1.014 08/29/2010 2007   PHURINE 7.0 08/29/2010 2007   GLUCOSEU NEGATIVE 08/29/2010 2007   HGBUR NEGATIVE 08/29/2010 2007   BILIRUBINUR NEGATIVE 08/29/2010 2007   KETONESUR NEGATIVE 08/29/2010 2007   PROTEINUR NEGATIVE 08/29/2010 2007   UROBILINOGEN 0.2 08/29/2010 2007   NITRITE NEGATIVE 08/29/2010 2007   LEUKOCYTESUR  08/29/2010  2007    NEGATIVE MICROSCOPIC NOT DONE ON URINES WITH NEGATIVE PROTEIN, BLOOD, LEUKOCYTES, NITRITE, OR GLUCOSE <1000 mg/dL.      Imaging Results:    Dg Chest 2 View  Result Date: 10/27/2017 CLINICAL DATA:  46 year old female with chest pain and increasing shortness of breath since this morning. EXAM: CHEST  2 VIEW COMPARISON:  12/07/2006 FINDINGS: The heart size and mediastinal contours are within normal limits. Both lungs are clear. The visualized skeletal structures are unremarkable. IMPRESSION: No active cardiopulmonary disease. Electronically Signed   By: Kristopher Oppenheim M.D.   On: 10/27/2017 21:46   Ct Angio Chest Pe W/cm &/or Wo Cm  Result Date: 10/28/2017 CLINICAL DATA:  Chest pain and left leg pain. 2 surgeries since October 5th. EXAM: CT ANGIOGRAPHY CHEST WITH CONTRAST TECHNIQUE: Multidetector CT imaging of the chest was performed using  the standard protocol during bolus administration of intravenous contrast. Multiplanar CT image reconstructions and MIPs were obtained to evaluate the vascular anatomy. CONTRAST:  158mL ISOVUE-370 IOPAMIDOL (ISOVUE-370) INJECTION 76% COMPARISON:  None. FINDINGS: Cardiovascular: Good opacification of the central and segmental pulmonary artery's. Multiple filling defects are demonstrated in the distal right main pulmonary artery, bilateral upper lobe pulmonary arteries, and bilateral lower lobe pulmonary arteries. Appearance is consistent with multiple acute pulmonary emboli. RV to LV ratio is 1.4. No contrast reflux into the hepatic veins. Normal heart size. No pericardial effusion. Normal caliber thoracic aorta. Great vessel origins are patent. Mediastinum/Nodes: Esophagus is decompressed. No significant lymphadenopathy in the chest. Lungs/Pleura: Lungs are clear and expanded. No focal consolidation or airspace disease. No pleural effusions. No pneumothorax. Airways are patent. Upper Abdomen: No acute abnormality. Musculoskeletal: No chest wall abnormality. No  acute or significant osseous findings. Review of the MIP images confirms the above findings. IMPRESSION: Multiple bilateral acute pulmonary emboli. Positive for acute PE with CT evidence of right heart strain (RV/LV Ratio = 1.4) consistent with at least submassive (intermediate risk) PE. The presence of right heart strain has been associated with an increased risk of morbidity and mortality. Please activate Code PE by paging [phone]. These results were called by telephone at the time of interpretation on 10/28/2017 at 12:17 am to Dr. Waynetta Pean , who verbally acknowledged these results. Electronically Signed   By: Lucienne Capers M.D.   On: 10/28/2017 00:19    My personal review of EKG: Rhythm NSR   Assessment & Plan:    Active Problems:   PE (pulmonary thromboembolism) ([place])   1. Pulmonary embolism-CTA shows right heart strain, continue IV heparin.  Will obtain echocardiogram in a.m.  Troponin mildly elevated at 0.13 will cycle troponin every 6 hours x3.  Consider formal pulmonary consultation based on echocardiogram result 2. Left leg pain-we will obtain duplex of lower extremities to rule out DVT 3. Recent hip surgery-continue muscle relaxant, will start Dilaudid as needed for pain 4. GERD-continue H2 blocker.   DVT Prophylaxis-   patient on heparin  AM Labs Ordered, also please review Full Orders  Family Communication: Admission, patients condition and plan of care including tests being ordered have been discussed with the patient and husband at bedside who indicate understanding and agree with the plan and Code Status.  Code Status: Full code  Admission status: Observation  Time spent in minutes : 60 minutes   Oswald Hillock M.D on 10/28/2017 at 4:11 AM  Between 7am to 7pm - Pager - [phone]. After 7pm go to [email or web address] - password [facility]  [facility] - Office  [phone]

## 2017-10-28 NOTE — Progress Notes (Addendum)
ANTICOAGULATION CONSULT NOTE - Follow Up Consult  Pharmacy Consult for heparin Indication: new pulmonary embolus  No Known Allergies  Patient Measurements: Height: 5\' 7"  (170.2 cm) Weight: 235 lb (106.6 kg) IBW/kg (Calculated) : 61.6 Heparin Dosing Weight: 86 kg  Vital Signs: Temp: 98.3 F (36.8 C) (11/24 0545) Temp Source: Oral (11/24 0545) BP: 143/90 (11/24 0545) Pulse Rate: 121 (11/24 0545)  Labs: Recent Labs    10/27/17 2210 10/27/17 2332 10/28/17 0609  HGB 13.7  --  12.7  HCT 40.7  --  38.0  PLT 229  --  211  APTT  --  28  --   LABPROT  --  14.8  --   INR  --  1.16  --   CREATININE 1.29*  --  1.13*  TROPONINI  --   --  0.30*    Estimated Creatinine Clearance: 76.5 mL/min (A) (by C-G formula based on SCr of 1.13 mg/dL (H)).  Assessment: Patient is a 46 y.o F s/p arthroscopy of hip with femroplasty repair on 10/24 and enthesopathy of left hip on 11/07 on aspirin 325 mg daily PTA, presented to the ED on 10/27/17 with c/o chest and left leg pain.  Chest CTA showed multiple bilateral acute pulmonary emboli with right heart strain.  Heparin drip started on admission for acute PE.  Today, 10/28/2017: - first heparin level now back supra-therapeutic at 1.01 - CBC stable - no bleeding documented  Goal of Therapy:  Heparin level 0.3-0.7 units/ml Monitor platelets by anticoagulation protocol: Yes   Plan:  - reduce heparin drip to 1350 units/hr - check 6 hr heparin level - monitor for s/s bleeding - f/u LE doppler results  Adden (5:43 PM): - LE doppler showed BL DVTs - heparin level is elevated at 0.88 but down after rate decreased to 1350 units/hr this morning. Per RN, no bleeding noted. Will reduce rate to 1150 units/hr. Check 6 hr heparin level  Kate Sweetman P 10/28/2017,8:24 AM

## 2017-10-29 LAB — CBC
HEMATOCRIT: 40.6 % (ref 36.0–46.0)
Hemoglobin: 13.5 g/dL (ref 12.0–15.0)
MCH: 27.9 pg (ref 26.0–34.0)
MCHC: 33.3 g/dL (ref 30.0–36.0)
MCV: 83.9 fL (ref 78.0–100.0)
Platelets: 214 10*3/uL (ref 150–400)
RBC: 4.84 MIL/uL (ref 3.87–5.11)
RDW: 14.3 % (ref 11.5–15.5)
WBC: 7.5 10*3/uL (ref 4.0–10.5)

## 2017-10-29 LAB — HIV ANTIBODY (ROUTINE TESTING W REFLEX): HIV SCREEN 4TH GENERATION: NONREACTIVE

## 2017-10-29 LAB — HEPARIN LEVEL (UNFRACTIONATED)
HEPARIN UNFRACTIONATED: 0.63 [IU]/mL (ref 0.30–0.70)
Heparin Unfractionated: 0.59 IU/mL (ref 0.30–0.70)

## 2017-10-29 LAB — BASIC METABOLIC PANEL
ANION GAP: 5 (ref 5–15)
BUN: 10 mg/dL (ref 6–20)
CALCIUM: 8.8 mg/dL — AB (ref 8.9–10.3)
CHLORIDE: 107 mmol/L (ref 101–111)
CO2: 24 mmol/L (ref 22–32)
Creatinine, Ser: 1.04 mg/dL — ABNORMAL HIGH (ref 0.44–1.00)
GFR calc non Af Amer: >60 mL/min (ref >60)
Glucose, Bld: 98 mg/dL (ref 65–99)
Potassium: 4 mmol/L (ref 3.5–5.1)
Sodium: 136 mmol/L (ref 135–145)

## 2017-10-29 LAB — TSH: TSH: 1.436 u[IU]/mL (ref 0.350–4.500)

## 2017-10-29 LAB — MAGNESIUM: Magnesium: 2.1 mg/dL (ref 1.7–2.4)

## 2017-10-29 NOTE — Progress Notes (Signed)
ANTICOAGULATION CONSULT NOTE - Follow Up Consult  Pharmacy Consult for heparin Indication: new pulmonary embolus and DVT  No Known Allergies  Patient Measurements: Height: 5\' 7"  (170.2 cm) Weight: 235 lb (106.6 kg) IBW/kg (Calculated) : 61.6 Heparin Dosing Weight: 86 kg  Vital Signs: Temp: 98.5 F (36.9 C) (11/25 0443) Temp Source: Oral (11/25 0443) BP: 132/84 (11/25 0443) Pulse Rate: 79 (11/25 0443)  Labs: Recent Labs    10/27/17 2210 10/27/17 2332 10/28/17 0609 10/28/17 0836 10/28/17 1236 10/28/17 1703 10/29/17 0035  HGB 13.7  --  12.7  --   --   --  13.5  HCT 40.7  --  38.0  --   --   --  40.6  PLT 229  --  211  --   --   --  214  APTT  --  28  --   --   --   --   --   LABPROT  --  14.8  --   --   --   --   --   INR  --  1.16  --   --   --   --   --   HEPARINUNFRC  --   --   --  1.01*  --  0.88* 0.59  CREATININE 1.29*  --  1.13*  --   --   --  1.04*  TROPONINI  --   --  0.30*  --  0.17* 0.15*  --     Estimated Creatinine Clearance: 83.1 mL/min (A) (by C-G formula based on SCr of 1.04 mg/dL (H)).  Assessment: Patient is a 46 y.o F s/p arthroscopy of hip with femroplasty repair on 10/24 and enthesopathy of left hip on 11/07 on aspirin 325 mg daily PTA, presented to the ED on 10/27/17 with c/o chest and left leg pain.  Chest CTA showed multiple bilateral acute pulmonary emboli with right heart strain and LE doppler positive for bilateral DVTs.  Patient's on heparin drip for acute PE and DVTs.  Today, 10/29/2017: - confirmatory heparin level is therapeutic at 0.63 - CBC stable - no bleeding documented  Goal of Therapy:  Heparin level 0.3-0.7 units/ml Monitor platelets by anticoagulation protocol: Yes   Plan:  - continue heparin drip at 1150 units/hr - daily heparin level - monitor for s/s bleeding   Jyair Kiraly P 10/29/2017,8:06 AM

## 2017-10-29 NOTE — Progress Notes (Signed)
Nutrition Brief Note  Patient identified on the Malnutrition Screening Tool (MST) Report  Wt Readings from Last 15 Encounters:  10/27/17 235 lb (106.6 kg)  08/09/16 235 lb (106.6 kg)    Body mass index is 36.81 kg/m. Patient meets criteria for obesity based on current BMI.   Current diet order is regular, patient is consuming approximately 75% of meals at this time. Labs and medications reviewed.   No nutrition interventions warranted at this time. If nutrition issues arise, please consult RD.   Clayton Bibles, MS, RD, [place] [facility] Pager: [phone] After Hours Pager: [phone]

## 2017-10-29 NOTE — Progress Notes (Signed)
ANTICOAGULATION CONSULT NOTE - Follow Up Consult  Pharmacy Consult for Heparin Indication: new pulmonary embolus   No Known Allergies  Patient Measurements: Height: 5\' 7"  (170.2 cm) Weight: 235 lb (106.6 kg) IBW/kg (Calculated) : 61.6 Heparin Dosing Weight:   Vital Signs: Temp: 98.5 F (36.9 C) (11/25 0443) Temp Source: Oral (11/25 0443) BP: 132/84 (11/25 0443) Pulse Rate: 79 (11/25 0443)  Labs: Recent Labs    10/27/17 2210 10/27/17 2332 10/28/17 0609 10/28/17 0836 10/28/17 1236 10/28/17 1703 10/29/17 0035  HGB 13.7  --  12.7  --   --   --  13.5  HCT 40.7  --  38.0  --   --   --  40.6  PLT 229  --  211  --   --   --  214  APTT  --  28  --   --   --   --   --   LABPROT  --  14.8  --   --   --   --   --   INR  --  1.16  --   --   --   --   --   HEPARINUNFRC  --   --   --  1.01*  --  0.88* 0.59  CREATININE 1.29*  --  1.13*  --   --   --  1.04*  TROPONINI  --   --  0.30*  --  0.17* 0.15*  --     Estimated Creatinine Clearance: 83.1 mL/min (A) (by C-G formula based on SCr of 1.04 mg/dL (H)).   Medications:  Infusions:  . sodium chloride 10 mL/hr at 10/28/17 0607  . heparin 1,150 Units/hr (10/28/17 1747)    Assessment: Patient with heparin level at goal.  No heparin issues noted.  Goal of Therapy:  Heparin level 0.3-0.7 units/ml Monitor platelets by anticoagulation protocol: Yes   Plan:  Continue heparin drip at current rate Recheck level at 0800  Tyler Deis, [place] 10/29/2017,6:02 AM

## 2017-10-29 NOTE — Progress Notes (Signed)
PROGRESS NOTE  Angela Owen VHQ:[id number] DOB: 11/03/1971 DOA: 10/27/2017 PCP: Carol Ada, MD  HPI/Recap of past 24 hours:  Feeling better, reports still has some sob at rest, less pleuritic chest pain , no cough, no tachycarida, on room air, no fever  Left leg edema has much improved, no pain  Husband at bedside  Assessment/Plan: Active Problems:   PE (pulmonary thromboembolism) ([place])  Acute bilateral DVT (L>R), submassive PE, likely provoked -s/p  elective gallbladder surgery on 10/5 and elective left hip surgery on 10/24 -Echo "Right ventricle:  The cavity size was mildly dilated. Wall thickness was normal. Systolic function was normal." -still report sob and pleuritic chest pain will Continue heparin drip for another day, plan to transition to eliquis tomorrow , will need 6-9 months therapy.  Significant DVT left lower extremity: I have discussed with vascular surgery Dr Scot Dock  regarding venous doppler result. Dr Scot Dock states no indication for lysis.  Continue anticoagualtion treatment. Left leg edema has much improved.  Mild elevated troponin, likely from PE troponin peaked at 0.3, trending down.  Echo lvef wnl  CKDII ua unremarkable  Renal dosing meds  Depression /anxiety: labile mood, denies si/hi Prn klonipin  Obesity: Body mass index is 36.81 kg/m.   Code Status: full  Family Communication: patient and husband  Disposition Plan: home [place] on 11/26.   Consultants:  Phone conversation with vascular surgery Dr Scot Dock on 11/24  Procedures:  none  Antibiotics:  none   Objective: BP 114/73 (BP Location: Left Arm)   Pulse 86   Temp 98.4 F (36.9 C) (Oral)   Resp 18   Ht 5\' 7"  (1.702 m)   Wt 106.6 kg (235 lb)   LMP 09/08/2017 (Approximate) Comment: HCG < 5 10-27-17  SpO2 99%   Breastfeeding? Unknown Comment: often has irregular periods, not unusual to be late  BMI 36.81 kg/m   Intake/Output Summary (Last 24 hours) at  10/29/2017 1641 Last data filed at 10/29/2017 1500 Gross per 24 hour  Intake 1444 ml  Output 300 ml  Net 1144 ml   Filed Weights   10/27/17 2121  Weight: 106.6 kg (235 lb)    Exam: Patient is examined daily including today on 10/29/2017, exams remain the same as of yesterday except that has changed    General:  NAD  Cardiovascular: RRR  Respiratory: CTABL  Abdomen: Soft/ND/NT, positive BS  Musculoskeletal: left lower leg Edema has much improved, nontender, left hip brace in place  Neuro: alert, oriented   Data Reviewed: Basic Metabolic Panel: Recent Labs  Lab 10/27/17 2210 10/28/17 0609 10/29/17 0035  NA 137 138 136  K 3.6 3.6 4.0  CL 103 105 107  CO2 26 25 24   GLUCOSE 101* 100* 98  BUN 11 11 10   CREATININE 1.29* 1.13* 1.04*  CALCIUM 9.4 8.7* 8.8*  MG  --   --  2.1   Liver Function Tests: Recent Labs  Lab 10/28/17 0609  AST 19  ALT 15  ALKPHOS 69  BILITOT 0.6  PROT 6.7  ALBUMIN 3.5   No results for input(s): LIPASE, AMYLASE in the last 168 hours. No results for input(s): AMMONIA in the last 168 hours. CBC: Recent Labs  Lab 10/27/17 2210 10/28/17 0609 10/29/17 0035  WBC 7.8 7.6 7.5  HGB 13.7 12.7 13.5  HCT 40.7 38.0 40.6  MCV 84.3 83.7 83.9  PLT 229 211 214   Cardiac Enzymes:   Recent Labs  Lab 10/28/17 0609 10/28/17 1236 10/28/17 1703  TROPONINI 0.30* 0.17* 0.15*   BNP (last 3 results) No results for input(s): BNP in the last 8760 hours.  ProBNP (last 3 results) No results for input(s): PROBNP in the last 8760 hours.  CBG: No results for input(s): GLUCAP in the last 168 hours.  No results found for this or any previous visit (from the past 240 hour(s)).   Studies: No results found.  Scheduled Meds: . famotidine  10 mg Oral Daily  . feeding supplement (ENSURE ENLIVE)  237 mL Oral BID BM    Continuous Infusions: . sodium chloride 10 mL/hr at 10/28/17 0607  . heparin 1,150 Units/hr (10/29/17 1205)      I have  personally reviewed and interpreted on  10/29/2017 daily labs, tele strips, imagings as discussed above under date review session and assessment and plans.  I reviewed all nursing notes, pharmacy notes,  vitals, pertinent old records  I have discussed plan of care as described above with RN , patient and family on 10/29/2017   Florencia Reasons MD, PhD  [facility] Pager [phone]. If 7PM-7AM, please contact night-coverage at [email or web address], password [facility] 10/29/2017, 4:41 PM  LOS: 1 day

## 2017-10-30 DIAGNOSIS — I2699 Other pulmonary embolism without acute cor pulmonale: Secondary | ICD-10-CM

## 2017-10-30 DIAGNOSIS — M79605 Pain in left leg: Secondary | ICD-10-CM

## 2017-10-30 LAB — CBC
HEMATOCRIT: 40.1 % (ref 36.0–46.0)
Hemoglobin: 13.3 g/dL (ref 12.0–15.0)
MCH: 28.1 pg (ref 26.0–34.0)
MCHC: 33.2 g/dL (ref 30.0–36.0)
MCV: 84.6 fL (ref 78.0–100.0)
PLATELETS: 209 10*3/uL (ref 150–400)
RBC: 4.74 MIL/uL (ref 3.87–5.11)
RDW: 14.4 % (ref 11.5–15.5)
WBC: 7 10*3/uL (ref 4.0–10.5)

## 2017-10-30 LAB — BASIC METABOLIC PANEL
Anion gap: 4 — ABNORMAL LOW (ref 5–15)
BUN: 13 mg/dL (ref 6–20)
CALCIUM: 8.7 mg/dL — AB (ref 8.9–10.3)
CHLORIDE: 110 mmol/L (ref 101–111)
CO2: 25 mmol/L (ref 22–32)
CREATININE: 1.05 mg/dL — AB (ref 0.44–1.00)
GFR calc non Af Amer: >60 mL/min (ref >60)
Glucose, Bld: 98 mg/dL (ref 65–99)
Potassium: 4.1 mmol/L (ref 3.5–5.1)
SODIUM: 139 mmol/L (ref 135–145)

## 2017-10-30 LAB — HEPARIN LEVEL (UNFRACTIONATED): Heparin Unfractionated: 0.42 IU/mL (ref 0.30–0.70)

## 2017-10-30 MED ORDER — APIXABAN 5 MG PO TABS: 10.0000 mg | ORAL_TABLET | Freq: Two times a day (BID) | ORAL | 0 refills | Status: DC

## 2017-10-30 MED ORDER — APIXABAN 5 MG PO TABS: 5.0000 mg | ORAL_TABLET | Freq: Two times a day (BID) | ORAL | 0 refills | Status: DC

## 2017-10-30 MED ORDER — APIXABAN 5 MG PO TABS
10.0000 mg | ORAL_TABLET | Freq: Two times a day (BID) | ORAL | Status: DC
  Administered 2017-10-30: 10 mg via ORAL
  Filled 2017-10-30: qty 2

## 2017-10-30 MED ORDER — APIXABAN 5 MG PO TABS: 5.0000 mg | ORAL_TABLET | Freq: Two times a day (BID) | ORAL | Status: DC

## 2017-10-30 NOTE — Progress Notes (Signed)
Received call from staff nurse about Eliquis. Informed nurse that per pharmacy note-patient was given an Eliquis 30day free trial discount coupon. Upon insurance check-patient Eliquis co pay $50/warfarin-$10-nurse informed. No further CM needs.

## 2017-10-30 NOTE — Discharge Summary (Signed)
Angela Owen GGY:[id number] DOB: 06-25-1971 DOA: 10/27/2017  PCP: Angela Ada, MD  Admit date: 10/27/2017  Discharge date: 10/30/2017  Admitted From: Home  Disposition:  Home   Recommendations for Outpatient Follow-up:   Follow up with PCP in 1-2 weeks  PCP Please obtain BMP/CBC, 2 view CXR in 1week,  (see Discharge instructions)   PCP Please follow up on the following pending results: None   Home Health: None   Equipment/Devices: Has crutches and L. Thigh/Knee brace  Consultations: None Discharge Condition: Stable   CODE STATUS: Full   Diet Recommendation:  Heart Healthy    Chief Complaint  Patient presents with  . Leg Pain  . Chest Pain     Brief history of present illness from the day of admission and additional interim summary    Angela Owen  is a 46 y.o. female,.. recent surgeries including cholecystectomy, left hip arthroscopy with labral debridement, who has been walking with the help of crutches, though she has not been as mobile as before the surgeries.  Patient came to hospital because she started having shortness of breath as well as leg pain for past 1 week.  Further workup showed bilateral lower extremity DVT with submassive PE, patient admits that she was noncompliant with aspirin that was given to her by her orthopedic surgeon for DVT prophylaxis.                                                                   Hospital Course    1.  Submassive PE with bilateral lower extremity DVT in the setting of recent left hip arthroscopic surgery at [facility], patient still using crutches and was noncompliant with aspirin for DVT prophylaxis.  She was treated for 48 hours with IV heparin, she currently has no shortness of breath at rest, vital signs are stable, she is not tachycardic, she is  saturating 100% on room air, in no distress, still has mild exertional shortness of breath but lower extremity swelling is much improved.  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  To follow with PCP and primary orthopedic surgeon within a week.  Aspirin and NSAIDs at this time have been stopped.  2.  Mildly elevated troponin trend stable non-ACS pattern, due to #1 above, echogram shows no wall motion abnormality or evidence of right ventricular strain, pulmonary artery pressure somewhat elevated which is understandable due to submassive PE, treatment as #1 above.  Follow with PCP.  3. Anxiety depression.  Not suicidal.  To follow with PCP for the same.  4.  Morbid obesity.  PCP to follow and manage.   Discharge diagnosis     Active Problems:   PE (  pulmonary thromboembolism) (HCC)   Left leg pain    Discharge instructions    Discharge Instructions    Diet - low sodium heart healthy   Complete by:  As directed    Discharge instructions   Complete by:  As directed    Follow with Primary MD Angela Ada, MD in 7 days   Get CBC, CMP, checked  by Primary MD in 5-7 days   Activity: As tolerated with Full fall precautions use walker/cane & assistance as needed  Disposition Home    Diet:  Heart Healthy   For Heart failure patients - Check your Weight same time everyday, if you gain over 2 pounds, or you develop in leg swelling, experience more shortness of breath or chest pain, call your Primary MD immediately. Follow Cardiac Low Salt Diet and 1.5 lit/day fluid restriction.  On your next visit with your primary care physician please Get Medicines reviewed and adjusted.  Please request your Prim.MD to go over all Hospital Tests and Procedure/Radiological results at the follow up, please get all Hospital records sent to your Prim MD by signing hospital release before  you go home.  If you experience worsening of your admission symptoms, develop shortness of breath, life threatening emergency, suicidal or homicidal thoughts you must seek medical attention immediately by calling 911 or calling your MD immediately  if symptoms less severe.  You Must read complete instructions/literature along with all the possible adverse reactions/side effects for all the Medicines you take and that have been prescribed to you. Take any new Medicines after you have completely understood and accpet all the possible adverse reactions/side effects.   Do not drive, operate heavy machinery, perform activities at heights, swimming or participation in water activities or provide baby sitting services if your were admitted for syncope or siezures until you have seen by Primary MD or a Neurologist and advised to do so again.  Do not drive when taking Pain medications.    Do not take more than prescribed Pain, Sleep and Anxiety Medications  Special Instructions: If you have smoked or chewed Tobacco  in the last 2 yrs please stop smoking, stop any regular Alcohol  and or any Recreational drug use.  Wear Seat belts while driving.   Please note  You were cared for by a hospitalist during your hospital stay. If you have any questions about your discharge medications or the care you received while you were in the hospital after you are discharged, you can call the unit and asked to speak with the hospitalist on call if the hospitalist that took care of you is not available. Once you are discharged, your primary care physician will handle any further medical issues. Please note that NO REFILLS for any discharge medications will be authorized once you are discharged, as it is imperative that you return to your primary care physician (or establish a relationship with a primary care physician if you do not have one) for your aftercare needs so that they can reassess your need for medications and  monitor your lab values.                                                       Angela Owen was admitted to the Hospital on 10/27/2017 and Discharged  10/30/2017 and should be excused from work/school  for 5  days starting from date -  10/27/2017 , may return to work/school without any restrictions.  Call Lala Lund MD, [facility]  [phone] with questions.  Lala Lund M.D on 10/30/2017,at 10:19 AM  [facility]   Office  [phone]      Discharge Medications   Allergies as of 10/30/2017   No Known Allergies     Medication List    STOP taking these medications   [employer] ASPIRIN EC 325 MG EC tablet Generic drug:  aspirin   naproxen 500 MG tablet Commonly known as:  NAPROSYN     TAKE these medications   apixaban 5 MG Tabs tablet Commonly known as:  ELIQUIS Take 2 tablets (10 mg total) by mouth 2 (two) times daily for 6 days.   apixaban 5 MG Tabs tablet Commonly known as:  ELIQUIS Take 1 tablet (5 mg total) by mouth 2 (two) times daily. Start taking on:  11/06/2017   metaxalone 800 MG tablet Commonly known as:  SKELAXIN Take 800 mg by mouth 2 (two) times daily as needed.   ranitidine 150 MG tablet Commonly known as:  ZANTAC Take 150 mg by mouth daily.       Follow-up Information    Angela Ada, MD. Schedule an appointment as soon as possible for a visit in 1 week(s).   Specialty:  Family Medicine Why:  Also follow with your orthopedic surgeon within a week Contact information: [address] [place] [address] [phone]           Major procedures and Radiology Reports - PLEASE review detailed and final reports thoroughly  -      Echo  - Left ventricle: The cavity size was normal. Systolic function was   normal. The estimated ejection fraction was in the range of 55%   to 60%. Wall motion was normal; there were no regional wall   motion abnormalities. Left ventricular diastolic function    parameters were normal. - Right ventricle: The cavity size was mildly dilated. Wall   thickness was normal. - Right atrium: The atrium was moderately dilated. - Atrial septum: No defect or patent foramen ovale was identified. - Pulmonary arteries: PA peak pressure: 56 mm Hg (S).   lower extremity venous ultrasound.  Bilateral lower extremity venous duplex completed. Positive small right popliteal DVT. Left positive for a more extensive DVT coursing through the posterior tibial vein through the popliteal vein. Unable to visualize the peronel vein well enough to evaluate.     Dg Chest 2 View  Result Date: 10/27/2017 CLINICAL DATA:  46 year old female with chest pain and increasing shortness of breath since this morning. EXAM: CHEST  2 VIEW COMPARISON:  12/07/2006 FINDINGS: The heart size and mediastinal contours are within normal limits. Both lungs are clear. The visualized skeletal structures are unremarkable. IMPRESSION: No active cardiopulmonary disease. Electronically Signed   By: Kristopher Oppenheim M.D.   On: 10/27/2017 21:46   Ct Angio Chest Pe W/cm &/or Wo Cm  Result Date: 10/28/2017 CLINICAL DATA:  Chest pain and left leg pain. 2 surgeries since October 5th. EXAM: CT ANGIOGRAPHY CHEST WITH CONTRAST TECHNIQUE: Multidetector CT imaging of the chest was performed using the standard protocol during bolus administration of intravenous contrast. Multiplanar CT image reconstructions and MIPs were obtained to evaluate the vascular anatomy. CONTRAST:  148mL ISOVUE-370 IOPAMIDOL (ISOVUE-370) INJECTION 76% COMPARISON:  None. FINDINGS: Cardiovascular: Good opacification of the central and segmental pulmonary artery's. Multiple filling defects are demonstrated in  the distal right main pulmonary artery, bilateral upper lobe pulmonary arteries, and bilateral lower lobe pulmonary arteries. Appearance is consistent with multiple acute pulmonary emboli. RV to LV ratio is 1.4. No contrast reflux into the  hepatic veins. Normal heart size. No pericardial effusion. Normal caliber thoracic aorta. Great vessel origins are patent. Mediastinum/Nodes: Esophagus is decompressed. No significant lymphadenopathy in the chest. Lungs/Pleura: Lungs are clear and expanded. No focal consolidation or airspace disease. No pleural effusions. No pneumothorax. Airways are patent. Upper Abdomen: No acute abnormality. Musculoskeletal: No chest wall abnormality. No acute or significant osseous findings. Review of the MIP images confirms the above findings. IMPRESSION: Multiple bilateral acute pulmonary emboli. Positive for acute PE with CT evidence of right heart strain (RV/LV Ratio = 1.4) consistent with at least submassive (intermediate risk) PE. The presence of right heart strain has been associated with an increased risk of morbidity and mortality. Please activate Code PE by paging [phone]. These results were called by telephone at the time of interpretation on 10/28/2017 at 12:17 am to Dr. Waynetta Pean , who verbally acknowledged these results. Electronically Signed   By: Lucienne Capers M.D.   On: 10/28/2017 00:19    Micro Results     No results found for this or any previous visit (from the past 240 hour(s)).  Today   Subjective    Angela Owen today has no headache,no chest abdominal pain,no new weakness tingling or numbness, feels much better wants to go home today.     Objective   Blood pressure 131/86, pulse 74, temperature 99.3 F (37.4 C), temperature source Oral, resp. rate 18, height 5\' 7"  (1.702 m), weight 106.6 kg (235 lb), last menstrual period 09/08/2017, SpO2 100 %, unknown if currently breastfeeding.   Intake/Output Summary (Last 24 hours) at 10/30/2017 1127 Last data filed at 10/30/2017 0600 Gross per 24 hour  Intake 870.63 ml  Output 0 ml  Net 870.63 ml    Exam Awake Alert, Oriented x 3, No new F.N deficits, Normal affect [place].AT,PERRAL Supple Neck,No JVD, No cervical  lymphadenopathy appriciated.  Symmetrical Chest wall movement, Good air movement bilaterally, CTAB RRR,No Gallops,Rubs or new Murmurs, No Parasternal Heave +ve B.Sounds, Abd Soft, Non tender, No organomegaly appriciated, No rebound -guarding or rigidity. No Cyanosis, Clubbing or edema, No new Rash or bruise, wearing left thigh brace from recent hip surgery   Data Review   CBC w Diff:  Lab Results  Component Value Date   WBC 7.0 10/30/2017   HGB 13.3 10/30/2017   HCT 40.1 10/30/2017   PLT 209 10/30/2017   LYMPHOPCT 26 08/29/2010   MONOPCT 6 08/29/2010   EOSPCT 2 08/29/2010   BASOPCT 1 08/29/2010    CMP:  Lab Results  Component Value Date   NA 139 10/30/2017   K 4.1 10/30/2017   CL 110 10/30/2017   CO2 25 10/30/2017   BUN 13 10/30/2017   CREATININE 1.05 (H) 10/30/2017   PROT 6.7 10/28/2017   ALBUMIN 3.5 10/28/2017   BILITOT 0.6 10/28/2017   ALKPHOS 69 10/28/2017   AST 19 10/28/2017   ALT 15 10/28/2017  .   Total Time in preparing paper work, data evaluation and todays exam - 33 minutes  Lala Lund M.D on 10/30/2017 at 11:27 AM  [facility]   Office  [phone]

## 2017-10-30 NOTE — Discharge Instructions (Addendum)
Information on my medicine - ELIQUIS (apixaban)  This medication education was reviewed with me or my healthcare representative as part of my discharge preparation.  Why was Eliquis prescribed for you? Eliquis was prescribed to treat blood clots that may have been found in the veins of your legs (deep vein thrombosis) or in your lungs (pulmonary embolism) and to reduce the risk of them occurring again.  What do You need to know about Eliquis ? The starting dose is 10 mg (two 5 mg tablets) taken TWICE daily for the FIRST SEVEN (7) DAYS, then on Monday December 3rd  the dose is reduced to ONE 5 mg tablet taken TWICE daily.  Eliquis may be taken with or without food.   Try to take the dose about the same time in the morning and in the evening. If you have difficulty swallowing the tablet whole please discuss with your pharmacist how to take the medication safely.  Take Eliquis exactly as prescribed and DO NOT stop taking Eliquis without talking to the doctor who prescribed the medication.  Stopping may increase your risk of developing a new blood clot.  Refill your prescription before you run out.  After discharge, you should have regular check-up appointments with your healthcare provider that is prescribing your Eliquis.    What do you do if you miss a dose? If a dose of ELIQUIS is not taken at the scheduled time, take it as soon as possible on the same day and twice-daily administration should be resumed. The dose should not be doubled to make up for a missed dose.  Important Safety Information A possible side effect of Eliquis is bleeding. You should call your healthcare provider right away if you experience any of the following: ? Bleeding from an injury or your nose that does not stop. ? Unusual colored urine (red or dark brown) or unusual colored stools (red or black). ? Unusual bruising for unknown reasons. ? A serious fall or if you hit your head (even if there is no  bleeding).  Some medicines may interact with Eliquis and might increase your risk of bleeding or clotting while on Eliquis. To help avoid this, consult your healthcare provider or pharmacist prior to using any new prescription or non-prescription medications, including herbals, vitamins, non-steroidal anti-inflammatory drugs (NSAIDs) and supplements.  This website has more information on Eliquis (apixaban): http://www.eliquis.com/eliquis/home  Follow with Primary MD Angela Ada, MD in 7 days   Get CBC, CMP, checked  by Primary MD in 5-7 days   Activity: As tolerated with Full fall precautions use walker/cane & assistance as needed  Disposition Home    Diet:  Heart Healthy   For Heart failure patients - Check your Weight same time everyday, if you gain over 2 pounds, or you develop in leg swelling, experience more shortness of breath or chest pain, call your Primary MD immediately. Follow Cardiac Low Salt Diet and 1.5 lit/day fluid restriction.  On your next visit with your primary care physician please Get Medicines reviewed and adjusted.  Please request your Prim.MD to go over all Hospital Tests and Procedure/Radiological results at the follow up, please get all Hospital records sent to your Prim MD by signing hospital release before you go home.  If you experience worsening of your admission symptoms, develop shortness of breath, life threatening emergency, suicidal or homicidal thoughts you must seek medical attention immediately by calling 911 or calling your MD immediately  if symptoms less severe.  You Must read complete  instructions/literature along with all the possible adverse reactions/side effects for all the Medicines you take and that have been prescribed to you. Take any new Medicines after you have completely understood and accpet all the possible adverse reactions/side effects.   Do not drive, operate heavy machinery, perform activities at heights, swimming or  participation in water activities or provide baby sitting services if your were admitted for syncope or siezures until you have seen by Primary MD or a Neurologist and advised to do so again.  Do not drive when taking Pain medications.    Do not take more than prescribed Pain, Sleep and Anxiety Medications  Special Instructions: If you have smoked or chewed Tobacco  in the last 2 yrs please stop smoking, stop any regular Alcohol  and or any Recreational drug use.  Wear Seat belts while driving.   Please note  You were cared for by a hospitalist during your hospital stay. If you have any questions about your discharge medications or the care you received while you were in the hospital after you are discharged, you can call the unit and asked to speak with the hospitalist on call if the hospitalist that took care of you is not available. Once you are discharged, your primary care physician will handle any further medical issues. Please note that NO REFILLS for any discharge medications will be authorized once you are discharged, as it is imperative that you return to your primary care physician (or establish a relationship with a primary care physician if you do not have one) for your aftercare needs so that they can reassess your need for medications and monitor your lab values.                                                       Angela Owen was admitted to the Hospital on 10/27/2017 and Discharged  10/30/2017 and should be excused from work/school   for 5  days starting from date -  10/27/2017 , may return to work/school without any restrictions.  Call Lala Lund MD, [facility]  [phone] with questions.  Lala Lund M.D on 10/30/2017,at 10:19 AM  [facility]   Office  [phone]

## 2017-10-30 NOTE — Progress Notes (Addendum)
ANTICOAGULATION CONSULT NOTE - Follow Up Consult  Pharmacy Consult for heparin>>>eliquis Indication: new pulmonary embolus and DVT  No Known Allergies  Patient Measurements: Height: 5\' 7"  (170.2 cm) Weight: 235 lb (106.6 kg) IBW/kg (Calculated) : 61.6 Heparin Dosing Weight: 86 kg  Vital Signs: Temp: 99.3 F (37.4 C) (11/26 0453) Temp Source: Oral (11/26 0453) BP: 131/86 (11/26 0453) Pulse Rate: 74 (11/26 0453)  Labs: Recent Labs    10/27/17 2332 10/28/17 0609  10/28/17 1236 10/28/17 1703 10/29/17 0035 10/29/17 0755 10/30/17 0446  HGB  --  12.7  --   --   --  13.5  --  13.3  HCT  --  38.0  --   --   --  40.6  --  40.1  PLT  --  211  --   --   --  214  --  209  APTT 28  --   --   --   --   --   --   --   LABPROT 14.8  --   --   --   --   --   --   --   INR 1.16  --   --   --   --   --   --   --   HEPARINUNFRC  --   --    < >  --  0.88* 0.59 0.63 0.42  CREATININE  --  1.13*  --   --   --  1.04*  --  1.05*  TROPONINI  --  0.30*  --  0.17* 0.15*  --   --   --    < > = values in this interval not displayed.    Estimated Creatinine Clearance: 82.3 mL/min (A) (by C-G formula based on SCr of 1.05 mg/dL (H)).  Assessment: Patient is a 46 y.o F s/p arthroscopy of hip with femroplasty repair on 10/24 and enthesopathy of left hip on 11/07 on aspirin 325 mg daily PTA, presented to the ED on 10/27/17 with c/o chest and left leg pain.  Chest CTA showed multiple bilateral acute pulmonary emboli with right heart strain and LE doppler positive for bilateral DVTs.  Patient's on heparin drip for acute PE and DVTs.  Today, 10/30/2017: -  heparin level is therapeutic at 0.42 - CBC stable - no bleeding documented  Goal of Therapy:  Heparin level 0.3-0.7 units/ml Monitor platelets by anticoagulation protocol: Yes   Plan: dc heparin drip Eliquis 10 mg po BID x 7 days then on Monday December 3rd start Eliquis 5 mg po BID Will educate patient and give 30 day free insurance  card  Eudelia Bunch, Pharm.D. [phone] 10/30/2017 10:25 AM

## 2017-10-30 NOTE — Care Management Note (Signed)
Case Management Note  Patient Details  Name: Angela Owen MRN: [id number] Date of Birth: [birth year]  Subjective/Objective: 46 y/o f admitted w/PE. From home. CM referral for med asst-noted per pharmacy Eliquis coupon 30day. No CM needs.                   Action/Plan:d/c home.   Expected Discharge Date:  10/30/17               Expected Discharge Plan:  Home/Self Care  In-House Referral:     Discharge planning Services  CM Consult  Post Acute Care Choice:    Choice offered to:     DME Arranged:    DME Agency:     HH Arranged:    HH Agency:     Status of Service:  Completed, signed off  If discussed at [employer] of Stay Meetings, dates discussed:    Additional Comments:  Dessa Phi, RN 10/30/2017, 12:10 PM

## 2017-11-01 DIAGNOSIS — M25551 Pain in right hip: Secondary | ICD-10-CM | POA: Diagnosis not present

## 2017-11-06 DIAGNOSIS — I2699 Other pulmonary embolism without acute cor pulmonale: Secondary | ICD-10-CM | POA: Diagnosis not present

## 2017-11-09 DIAGNOSIS — M25652 Stiffness of left hip, not elsewhere classified: Secondary | ICD-10-CM | POA: Diagnosis not present

## 2017-11-15 DIAGNOSIS — M25652 Stiffness of left hip, not elsewhere classified: Secondary | ICD-10-CM | POA: Diagnosis not present

## 2017-11-16 DIAGNOSIS — I2699 Other pulmonary embolism without acute cor pulmonale: Secondary | ICD-10-CM | POA: Diagnosis not present

## 2017-11-21 ENCOUNTER — Ambulatory Visit
Admission: RE | Admit: 2017-11-21 | Discharge: 2017-11-21 | Disposition: A | Discharge status: Home / Self Care | Payer: 59 | Source: Ambulatory Visit | Attending: Family Medicine | Admitting: Family Medicine

## 2017-11-21 ENCOUNTER — Other Ambulatory Visit: Payer: Self-pay | Admitting: Family Medicine

## 2017-11-21 DIAGNOSIS — M79605 Pain in left leg: Secondary | ICD-10-CM | POA: Diagnosis not present

## 2017-11-21 DIAGNOSIS — I2699 Other pulmonary embolism without acute cor pulmonale: Secondary | ICD-10-CM

## 2017-11-21 DIAGNOSIS — Z86718 Personal history of other venous thrombosis and embolism: Secondary | ICD-10-CM

## 2017-11-21 DIAGNOSIS — M7989 Other specified soft tissue disorders: Secondary | ICD-10-CM

## 2017-11-21 DIAGNOSIS — R0602 Shortness of breath: Secondary | ICD-10-CM | POA: Diagnosis not present

## 2017-11-24 DIAGNOSIS — M25652 Stiffness of left hip, not elsewhere classified: Secondary | ICD-10-CM | POA: Diagnosis not present

## 2017-11-29 DIAGNOSIS — M25652 Stiffness of left hip, not elsewhere classified: Secondary | ICD-10-CM | POA: Diagnosis not present

## 2017-12-11 DIAGNOSIS — M25652 Stiffness of left hip, not elsewhere classified: Secondary | ICD-10-CM | POA: Diagnosis not present

## 2017-12-15 DIAGNOSIS — M25652 Stiffness of left hip, not elsewhere classified: Secondary | ICD-10-CM | POA: Diagnosis not present

## 2017-12-22 DIAGNOSIS — M25659 Stiffness of unspecified hip, not elsewhere classified: Secondary | ICD-10-CM | POA: Diagnosis not present

## 2017-12-26 DIAGNOSIS — M25652 Stiffness of left hip, not elsewhere classified: Secondary | ICD-10-CM | POA: Diagnosis not present

## 2018-01-02 DIAGNOSIS — I2699 Other pulmonary embolism without acute cor pulmonale: Secondary | ICD-10-CM | POA: Diagnosis not present

## 2018-01-02 DIAGNOSIS — I82409 Acute embolism and thrombosis of unspecified deep veins of unspecified lower extremity: Secondary | ICD-10-CM | POA: Diagnosis not present

## 2018-01-04 DIAGNOSIS — M25652 Stiffness of left hip, not elsewhere classified: Secondary | ICD-10-CM | POA: Diagnosis not present

## 2018-01-24 DIAGNOSIS — M25652 Stiffness of left hip, not elsewhere classified: Secondary | ICD-10-CM | POA: Diagnosis not present

## 2018-01-24 DIAGNOSIS — M25562 Pain in left knee: Secondary | ICD-10-CM | POA: Diagnosis not present

## 2018-02-01 DIAGNOSIS — M25652 Stiffness of left hip, not elsewhere classified: Secondary | ICD-10-CM | POA: Diagnosis not present

## 2018-02-13 DIAGNOSIS — M25652 Stiffness of left hip, not elsewhere classified: Secondary | ICD-10-CM | POA: Diagnosis not present

## 2018-03-08 DIAGNOSIS — M50121 Cervical disc disorder at C4-C5 level with radiculopathy: Secondary | ICD-10-CM | POA: Diagnosis not present

## 2018-03-08 DIAGNOSIS — I10 Essential (primary) hypertension: Secondary | ICD-10-CM | POA: Diagnosis not present

## 2018-03-08 DIAGNOSIS — M50321 Other cervical disc degeneration at C4-C5 level: Secondary | ICD-10-CM | POA: Diagnosis not present

## 2018-03-16 DIAGNOSIS — M76892 Other specified enthesopathies of left lower limb, excluding foot: Secondary | ICD-10-CM | POA: Diagnosis not present

## 2018-03-16 DIAGNOSIS — M76891 Other specified enthesopathies of right lower limb, excluding foot: Secondary | ICD-10-CM | POA: Diagnosis not present

## 2018-03-16 DIAGNOSIS — M25859 Other specified joint disorders, unspecified hip: Secondary | ICD-10-CM | POA: Diagnosis not present

## 2018-04-15 IMAGING — CT CT ANGIO CHEST
2 of 7 series · 17 of 46 positions shown · IV contrast (iopamidol)
Comparison: None.

CLINICAL DATA: Chest pain and left leg pain. 2 surgeries since
[REDACTED].

EXAM:
CT ANGIOGRAPHY CHEST WITH CONTRAST
TECHNIQUE: Multidetector CT imaging of the chest was performed using the
standard protocol during bolus administration of intravenous
contrast. Multiplanar CT image reconstructions and MIPs were
obtained to evaluate the vascular anatomy.
CONTRAST:  100mL 73EPS6-E9G IOPAMIDOL (73EPS6-E9G) INJECTION 76%

[Series 6: thins for pacs · axial · 0.71mm/px · z∈[-315,-84]mm · 14 of 253 slices shown]
[im 11/253  lung]
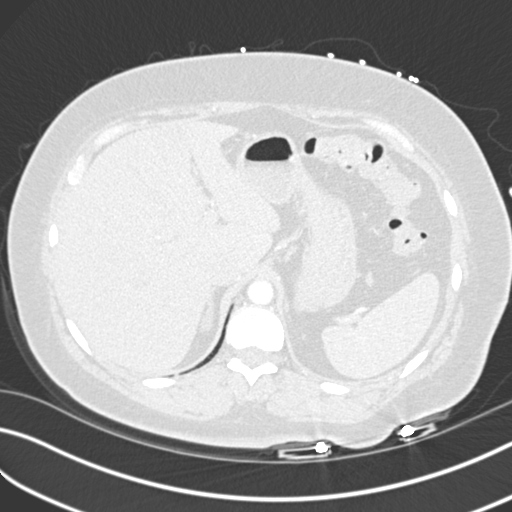
[im 33/253  soft-tissue]
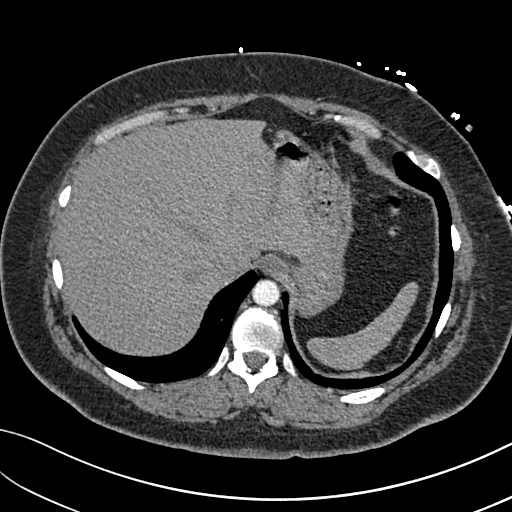
[im 44/253  lung]
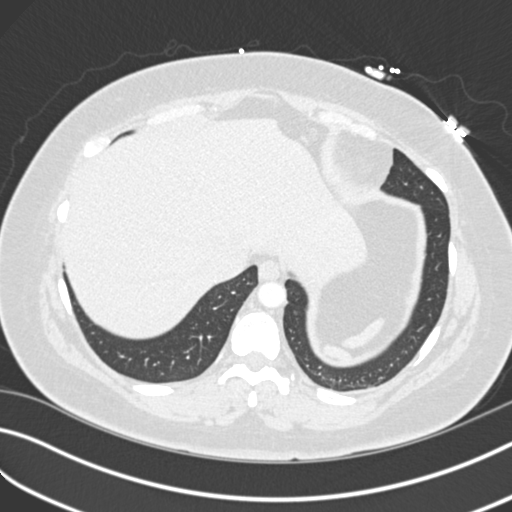
[im 66/253  soft-tissue]
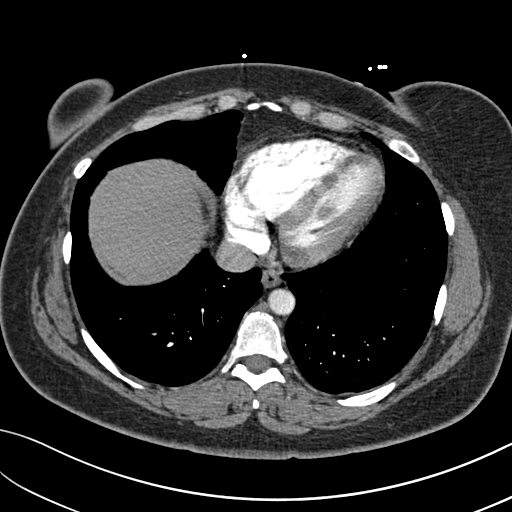
[im 88/253  lung]
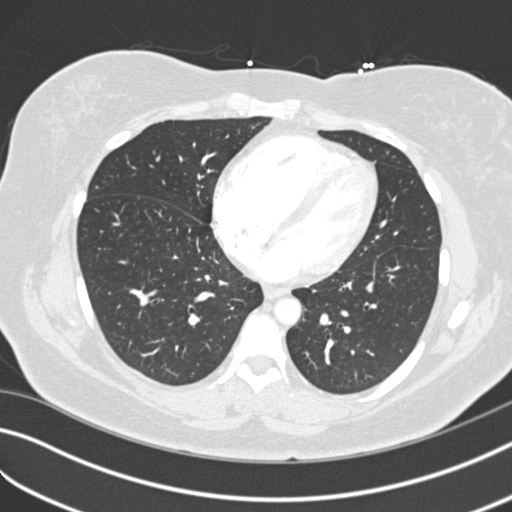
[im 99/253  soft-tissue]
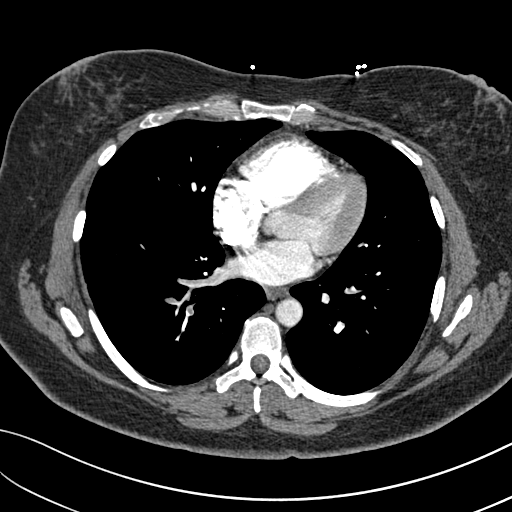
[im 121/253  lung]
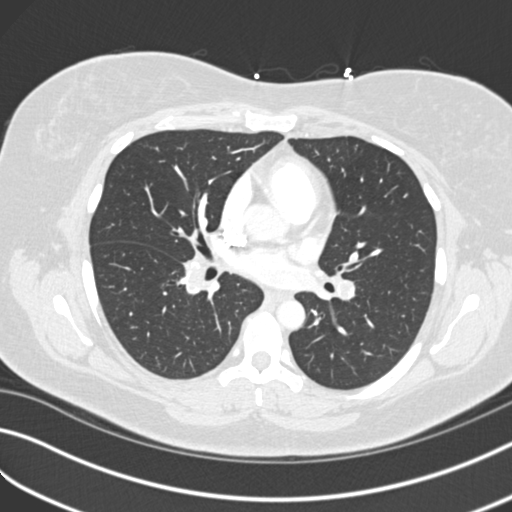
[im 132/253  soft-tissue]
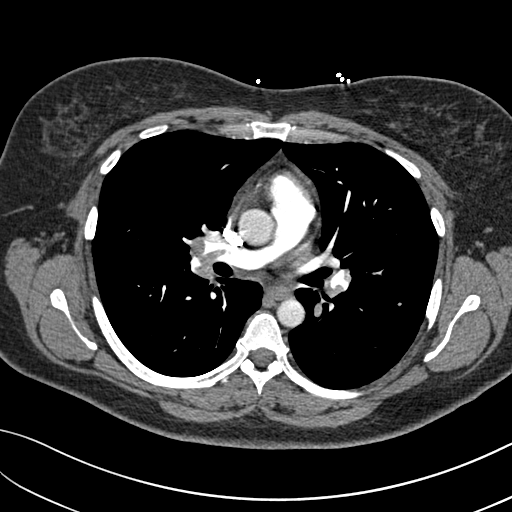
[im 154/253  lung]
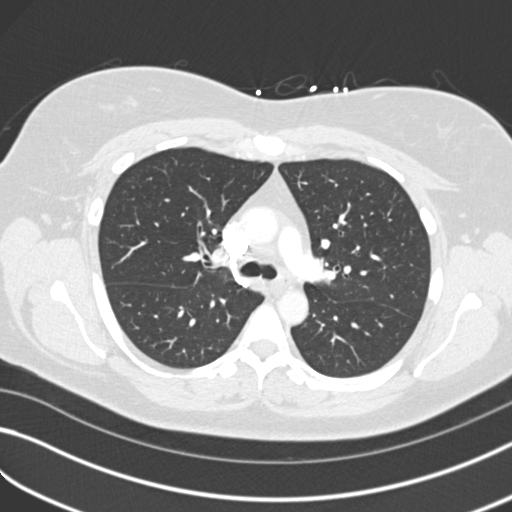
[im 165/253  soft-tissue]
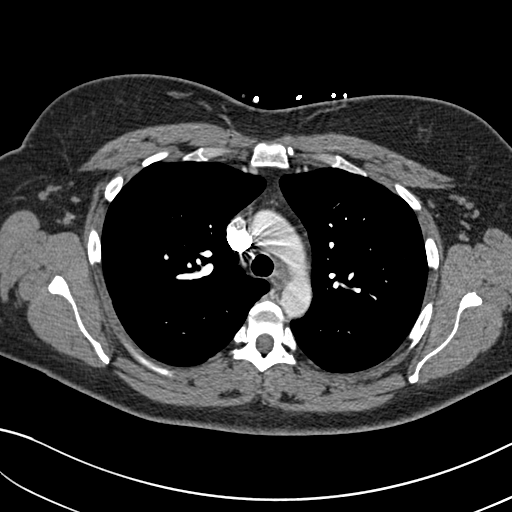
[im 187/253  lung]
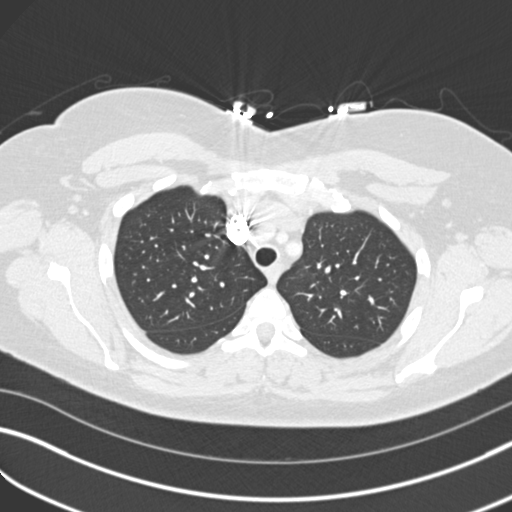
[im 209/253  soft-tissue]
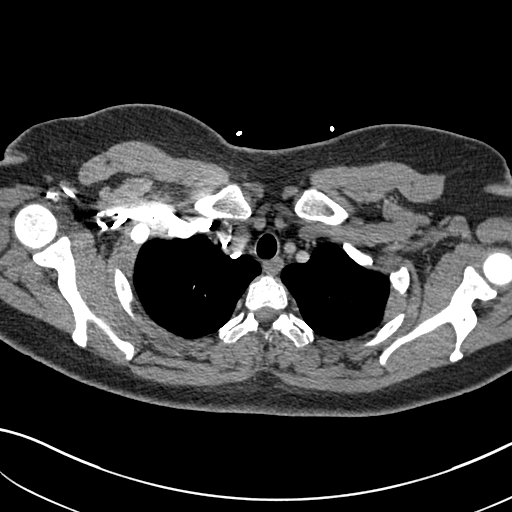
[im 220/253  lung]
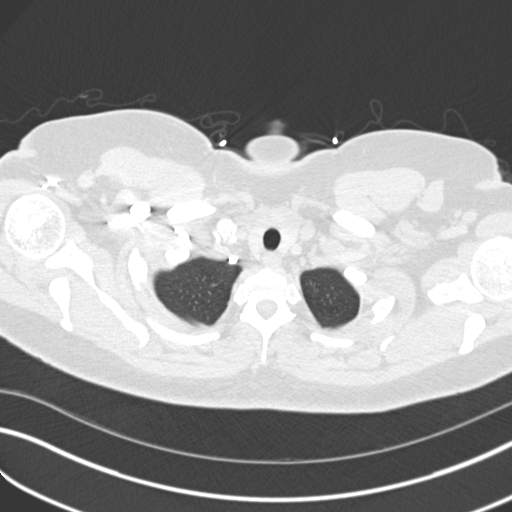
[im 242/253  soft-tissue]
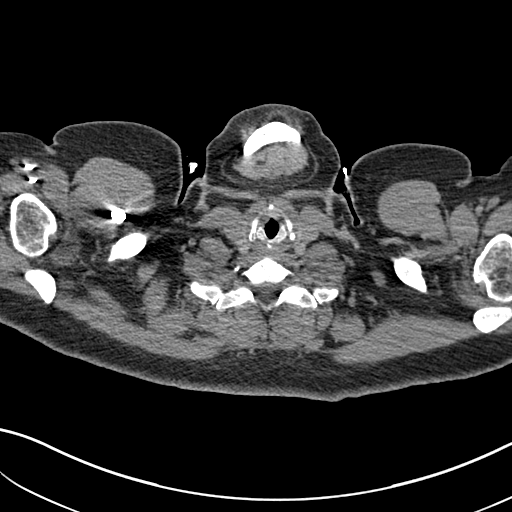

[Series 8: coronal mpr · coronal · 0.51mm/px · 3 of 132 slices shown]
[im 33/132  soft-tissue]
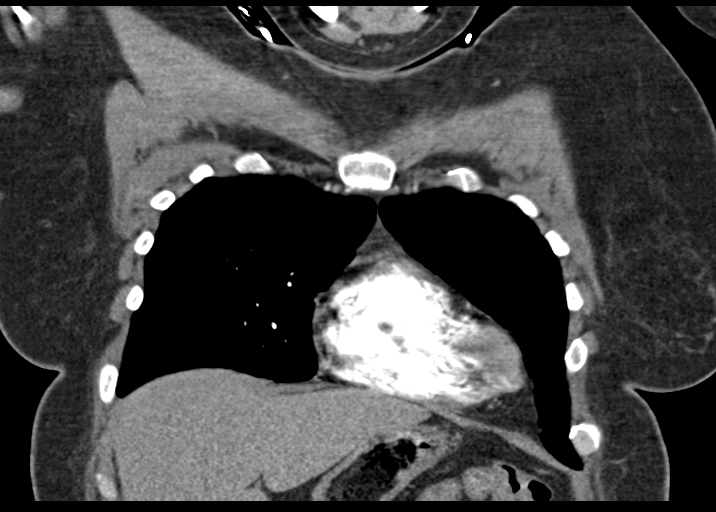
[im 66/132  soft-tissue]
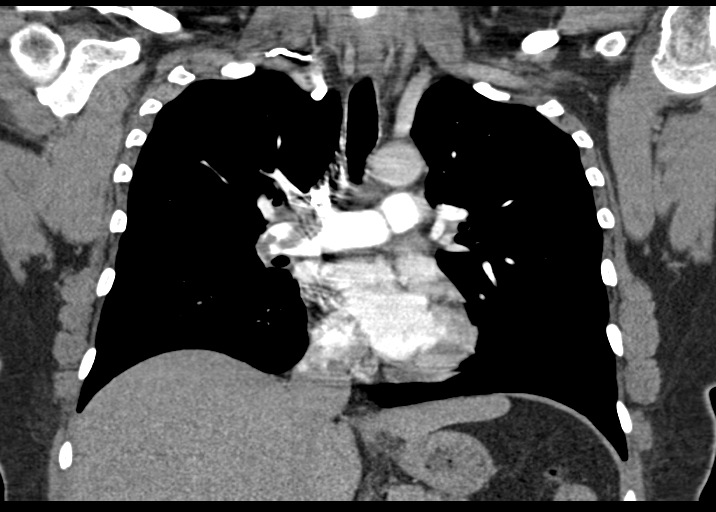
[im 99/132  soft-tissue]
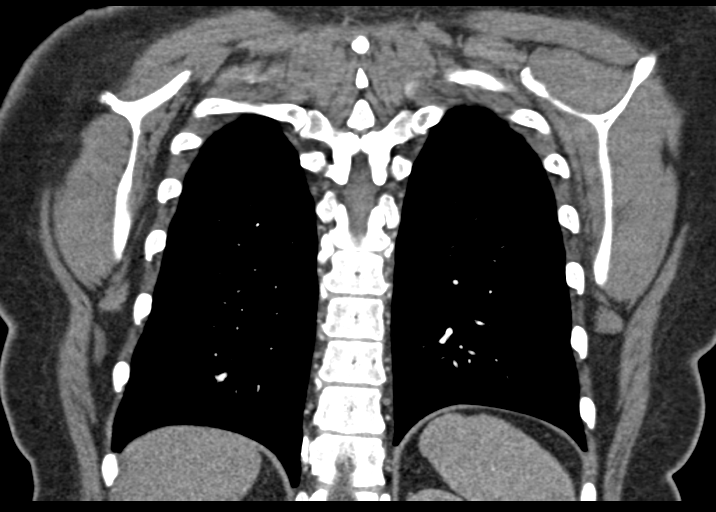

[17 of 46 positions shown; findings below may reference images not displayed]

FINDINGS: Cardiovascular: Good opacification of the central and segmental
pulmonary artery's. Multiple filling defects are demonstrated in the
distal right main pulmonary artery, bilateral upper lobe pulmonary
arteries, and bilateral lower lobe pulmonary arteries. Appearance is
consistent with multiple acute pulmonary emboli. RV to LV ratio is
1.4. No contrast reflux into the hepatic veins. Normal heart size.
No pericardial effusion. Normal caliber thoracic aorta. Great vessel
origins are patent.

Mediastinum/Nodes: Esophagus is decompressed. No significant
lymphadenopathy in the chest.

Lungs/Pleura: Lungs are clear and expanded. No focal consolidation
or airspace disease. No pleural effusions. No pneumothorax. Airways
are patent.

Upper Abdomen: No acute abnormality.

Musculoskeletal: No chest wall abnormality. No acute or significant
osseous findings.

Review of the MIP images confirms the above findings.
IMPRESSION: Multiple bilateral acute pulmonary emboli. Positive for acute PE
with CT evidence of right heart strain (RV/LV Ratio = 1.4)
consistent with at least submassive (intermediate risk) PE. The
presence of right heart strain has been associated with an increased
risk of morbidity and mortality. Please activate Code PE by paging
[phone].

These results were called by telephone at the time of interpretation
on [DATE] at 10/28/2017 to Dr. NOSE LAO , who verbally
acknowledged these results.

## 2018-05-17 DIAGNOSIS — Z0100 Encounter for examination of eyes and vision without abnormal findings: Secondary | ICD-10-CM | POA: Diagnosis not present

## 2018-05-24 DIAGNOSIS — Z0000 Encounter for general adult medical examination without abnormal findings: Secondary | ICD-10-CM | POA: Diagnosis not present

## 2018-05-24 DIAGNOSIS — Z23 Encounter for immunization: Secondary | ICD-10-CM | POA: Diagnosis not present

## 2018-05-24 DIAGNOSIS — E785 Hyperlipidemia, unspecified: Secondary | ICD-10-CM | POA: Diagnosis not present

## 2018-07-05 DIAGNOSIS — M50321 Other cervical disc degeneration at C4-C5 level: Secondary | ICD-10-CM | POA: Diagnosis not present

## 2018-07-10 ENCOUNTER — Other Ambulatory Visit: Payer: Self-pay | Admitting: Rehabilitation

## 2018-07-10 DIAGNOSIS — M50321 Other cervical disc degeneration at C4-C5 level: Secondary | ICD-10-CM

## 2018-07-21 ENCOUNTER — Ambulatory Visit
Admission: RE | Admit: 2018-07-21 | Discharge: 2018-07-21 | Disposition: A | Discharge status: Home / Self Care | Payer: 59 | Source: Ambulatory Visit | Attending: Rehabilitation | Admitting: Rehabilitation

## 2018-07-21 DIAGNOSIS — M50321 Other cervical disc degeneration at C4-C5 level: Secondary | ICD-10-CM

## 2018-07-26 DIAGNOSIS — M50321 Other cervical disc degeneration at C4-C5 level: Secondary | ICD-10-CM | POA: Diagnosis not present

## 2018-09-04 ENCOUNTER — Other Ambulatory Visit: Payer: Self-pay | Admitting: General Surgery

## 2018-09-04 DIAGNOSIS — R109 Unspecified abdominal pain: Secondary | ICD-10-CM

## 2018-09-08 ENCOUNTER — Other Ambulatory Visit: Payer: Self-pay

## 2018-09-08 ENCOUNTER — Emergency Department (HOSPITAL_COMMUNITY)
Admission: EM | Admit: 2018-09-08 | Discharge: 2018-09-09 | Disposition: A | Discharge status: Home / Self Care | Payer: 59 | Attending: Emergency Medicine | Admitting: Emergency Medicine

## 2018-09-08 ENCOUNTER — Emergency Department (HOSPITAL_COMMUNITY): Payer: 59

## 2018-09-08 ENCOUNTER — Encounter (HOSPITAL_COMMUNITY): Payer: Self-pay | Admitting: Emergency Medicine

## 2018-09-08 DIAGNOSIS — R072 Precordial pain: Secondary | ICD-10-CM | POA: Diagnosis not present

## 2018-09-08 DIAGNOSIS — Z86711 Personal history of pulmonary embolism: Secondary | ICD-10-CM | POA: Insufficient documentation

## 2018-09-08 DIAGNOSIS — R079 Chest pain, unspecified: Secondary | ICD-10-CM | POA: Diagnosis not present

## 2018-09-08 LAB — CBC
HEMATOCRIT: 38.5 % (ref 36.0–46.0)
Hemoglobin: 12.5 g/dL (ref 12.0–15.0)
MCH: 25.9 pg — ABNORMAL LOW (ref 26.0–34.0)
MCHC: 32.5 g/dL (ref 30.0–36.0)
MCV: 79.9 fL (ref 78.0–100.0)
Platelets: 310 10*3/uL (ref 150–400)
RBC: 4.82 MIL/uL (ref 3.87–5.11)
RDW: 15.2 % (ref 11.5–15.5)
WBC: 6.7 10*3/uL (ref 4.0–10.5)

## 2018-09-08 LAB — BASIC METABOLIC PANEL
Anion gap: 8 (ref 5–15)
BUN: 17 mg/dL (ref 6–20)
CHLORIDE: 105 mmol/L (ref 98–111)
CO2: 27 mmol/L (ref 22–32)
CREATININE: 1.35 mg/dL — AB (ref 0.44–1.00)
Calcium: 9.3 mg/dL (ref 8.9–10.3)
GFR calc Af Amer: 53 mL/min — ABNORMAL LOW (ref >60)
GFR calc non Af Amer: 46 mL/min — ABNORMAL LOW (ref >60)
Glucose, Bld: 86 mg/dL (ref 70–99)
POTASSIUM: 3.9 mmol/L (ref 3.5–5.1)
SODIUM: 140 mmol/L (ref 135–145)

## 2018-09-08 LAB — POCT I-STAT TROPONIN I: Troponin i, poc: 0 ng/mL (ref 0.00–0.08)

## 2018-09-08 LAB — D-DIMER, QUANTITATIVE: D-Dimer, Quant: 0.96 ug/mL-FEU — ABNORMAL HIGH (ref 0.00–0.50)

## 2018-09-08 LAB — TROPONIN I

## 2018-09-08 LAB — I-STAT BETA HCG BLOOD, ED (NOT ORDERABLE): I-stat hCG, quantitative: <5 m[IU]/mL (ref <5)

## 2018-09-08 NOTE — ED Provider Notes (Signed)
Emergency Department Provider Note   I have reviewed the triage vital signs and the nursing notes.   HISTORY  Chief Complaint Chest Pain   HPI Angela Owen is a 47 y.o. female history of DVT and pulmonary embolus after surgery the presents emerged from today with chest pain.  Patient states that she noticed it yesterday.  Is in her left chest and kind of goes to her left neck.  Does not radiate down her arm.  She says the neck pain may be different as she seems to remember this hurting worse when she moves her head back and forth and sometimes going into her trapezius.  No rashes.  Patient has any lower extremity swelling is any worse than before.  No fever or cough.  No trauma.  No hemoptysis.  No productive sputum. No other associated or modifying symptoms.    History reviewed. No pertinent past medical history.  Patient Active Problem List   Diagnosis Date Noted  . Left leg pain   . PE (pulmonary thromboembolism) ([place]) 10/28/2017    Past Surgical History:  Procedure Laterality Date  . FOOT SURGERY Left   . TOE SURGERY Right     Current Outpatient Rx  . Order #: [id number] Class: Print  . Order #: [id number] Class: Print  . Order #: [id number] Class: Historical Med  . Order #: [id number] Class: Historical Med    Allergies Patient has no known allergies.  History reviewed. No pertinent family history.  Social History Social History   Tobacco Use  . Smoking status: Never Smoker  . Smokeless tobacco: Never Used  Substance Use Topics  . Alcohol use: Yes    Comment: opcc  . Drug use: No    Review of Systems  All other systems negative except as documented in the HPI. All pertinent positives and negatives as reviewed in the HPI. ____________________________________________   PHYSICAL EXAM:  VITAL SIGNS: ED Triage Vitals  Enc Vitals Group     BP 09/08/18 1918 135/86     Pulse Rate 09/08/18 1918 (!) 56     Resp 09/08/18 1918 11     Temp 09/08/18 1918  98.8 F (37.1 C)     Temp Source 09/08/18 1918 Oral     SpO2 09/08/18 1918 100 %     Weight 09/08/18 1919 220 lb (99.8 kg)     Height 09/08/18 1919 5\' 7"  (1.702 m)    Constitutional: Alert and oriented. Well appearing and in no acute distress. Eyes: Conjunctivae are normal. PERRL. EOMI. Head: Atraumatic. Nose: No congestion/rhinnorhea. Mouth/Throat: Mucous membranes are moist.  Oropharynx non-erythematous. Neck: No stridor.  No meningeal signs.   Cardiovascular: Normal rate, regular rhythm. Good peripheral circulation. Grossly normal heart sounds.   Respiratory: Normal respiratory effort.  No retractions. Lungs CTAB. Gastrointestinal: Soft and nontender. No distention.  Musculoskeletal: No lower extremity tenderness nor edema. No gross deformities of extremities. Neurologic:  Normal speech and language. No gross focal neurologic deficits are appreciated.  Skin:  Skin is warm, dry and intact. No rash noted.   ____________________________________________   LABS (all labs ordered are listed, but only abnormal results are displayed)  Labs Reviewed  BASIC METABOLIC PANEL - Abnormal; Notable for the following components:      Result Value   Creatinine, Ser 1.35 (*)    GFR calc non Af Amer 46 (*)    GFR calc Af Amer 53 (*)    All other components within normal limits  CBC - Abnormal; Notable for the  following components:   MCH 25.9 (*)    All other components within normal limits  D-DIMER, QUANTITATIVE (NOT AT [facility]) - Abnormal; Notable for the following components:   D-Dimer, Quant 0.96 (*)    All other components within normal limits  TROPONIN I  I-STAT TROPONIN, ED  I-STAT BETA HCG BLOOD, ED ([facility], [facility], [facility] ONLY)  I-STAT BETA HCG BLOOD, ED (NOT ORDERABLE)  POCT I-STAT TROPONIN I   ____________________________________________  EKG   EKG Interpretation  Date/Time:    Ventricular Rate:    PR Interval:    QRS Duration:   QT Interval:    QTC Calculation:   R Axis:       Text Interpretation:         ____________________________________________  RADIOLOGY  Dg Chest 2 View  Result Date: 09/08/2018 CLINICAL DATA:  Left-sided chest pain EXAM: CHEST - 2 VIEW COMPARISON:  November 21, 2017 FINDINGS: The heart size and mediastinal contours are within normal limits. Both lungs are clear. The visualized skeletal structures are unremarkable. IMPRESSION: No active cardiopulmonary disease. Electronically Signed   By: Dorise Bullion III M.D   On: 09/08/2018 19:39    ____________________________________________   PROCEDURES  Procedure(s) performed:   Procedures   ____________________________________________   INITIAL IMPRESSION / ASSESSMENT AND PLAN / ED COURSE  Overall low suspicion for PE however secondary to her history of deep will be ordered.  She does have family history of early coronary artery disease but really no other risk factors besides age and obesity so we will get a delta troponin to rule out ACS.  Care transferred to Dr. Randal Buba pending CTA to eval for PE. otherwise ruled out for ACS. Stable for discharge if negative. Possibly stable for discharge with pcp follow up if positive and no strain.      Pertinent labs & imaging results that were available during my care of the patient were reviewed by me and considered in my medical decision making (see chart for details).  ____________________________________________  FINAL CLINICAL IMPRESSION(S) / ED DIAGNOSES  Final diagnoses:  Precordial pain     MEDICATIONS GIVEN DURING THIS VISIT:  Medications - No data to display   NEW OUTPATIENT MEDICATIONS STARTED DURING THIS VISIT:  New Prescriptions   No medications on file    Note:  This note was prepared with assistance of Dragon voice recognition software. Occasional wrong-word or sound-a-like substitutions may have occurred due to the inherent limitations of voice recognition software.   Merrily Pew, MD 09/09/18 2117

## 2018-09-08 NOTE — ED Triage Notes (Signed)
Pt to ed with c/o of left sided chest pain. Pt states the pain has radiated to her left shoulder and down her arm. Pt has hx of blood clots in her legs that turned to bilateral PE's. Pt does take Asprin 324 mg  2x week. Pt denies SOB or N/V/D.

## 2018-09-08 NOTE — ED Notes (Signed)
Portable used for ED EKG, 2nd copy at triage nurse station if needed.

## 2018-09-09 ENCOUNTER — Encounter (HOSPITAL_COMMUNITY): Payer: Self-pay | Admitting: Radiology

## 2018-09-09 ENCOUNTER — Emergency Department (HOSPITAL_COMMUNITY): Payer: 59

## 2018-09-09 DIAGNOSIS — R079 Chest pain, unspecified: Secondary | ICD-10-CM | POA: Diagnosis not present

## 2018-09-09 MED ORDER — IOPAMIDOL (ISOVUE-370) INJECTION 76%
INTRAVENOUS | Status: AC
  Filled 2018-09-09: qty 100

## 2018-09-09 MED ORDER — IOPAMIDOL (ISOVUE-370) INJECTION 76%
100.0000 mL | Freq: Once | INTRAVENOUS | Status: AC | PRN
  Administered 2018-09-09: 100 mL via INTRAVENOUS

## 2018-09-09 MED ORDER — SODIUM CHLORIDE 0.9 % IJ SOLN
INTRAMUSCULAR | Status: AC
  Filled 2018-09-09: qty 50

## 2018-09-10 ENCOUNTER — Ambulatory Visit
Admission: RE | Admit: 2018-09-10 | Discharge: 2018-09-10 | Disposition: A | Discharge status: Home / Self Care | Payer: 59 | Source: Ambulatory Visit | Attending: General Surgery | Admitting: General Surgery

## 2018-09-10 DIAGNOSIS — D259 Leiomyoma of uterus, unspecified: Secondary | ICD-10-CM | POA: Diagnosis not present

## 2018-09-10 DIAGNOSIS — R109 Unspecified abdominal pain: Secondary | ICD-10-CM

## 2018-09-10 MED ORDER — IOPAMIDOL (ISOVUE-300) INJECTION 61%
100.0000 mL | Freq: Once | INTRAVENOUS | Status: AC | PRN
  Administered 2018-09-10: 100 mL via INTRAVENOUS

## 2018-09-20 DIAGNOSIS — R109 Unspecified abdominal pain: Secondary | ICD-10-CM | POA: Diagnosis not present

## 2018-09-20 DIAGNOSIS — M549 Dorsalgia, unspecified: Secondary | ICD-10-CM | POA: Diagnosis not present

## 2018-10-26 DIAGNOSIS — M76891 Other specified enthesopathies of right lower limb, excluding foot: Secondary | ICD-10-CM | POA: Diagnosis not present

## 2018-10-26 DIAGNOSIS — M25851 Other specified joint disorders, right hip: Secondary | ICD-10-CM | POA: Diagnosis not present

## 2018-10-26 DIAGNOSIS — Z9889 Other specified postprocedural states: Secondary | ICD-10-CM | POA: Diagnosis not present

## 2018-10-31 DIAGNOSIS — M25521 Pain in right elbow: Secondary | ICD-10-CM | POA: Diagnosis not present

## 2018-10-31 DIAGNOSIS — M7712 Lateral epicondylitis, left elbow: Secondary | ICD-10-CM | POA: Diagnosis not present

## 2018-10-31 DIAGNOSIS — M7711 Lateral epicondylitis, right elbow: Secondary | ICD-10-CM | POA: Diagnosis not present

## 2019-01-17 IMAGING — US US EXTREM LOW VENOUS*L*
1 series · 13 of 24 positions shown · non-contrast
Comparison: 02/20/2017

CLINICAL DATA: 46-year-old female with a history of pain



[Series 1: us extrem low venous*left* · 0.08mm/px · 13 of 29 slices shown]
[im 1/29]
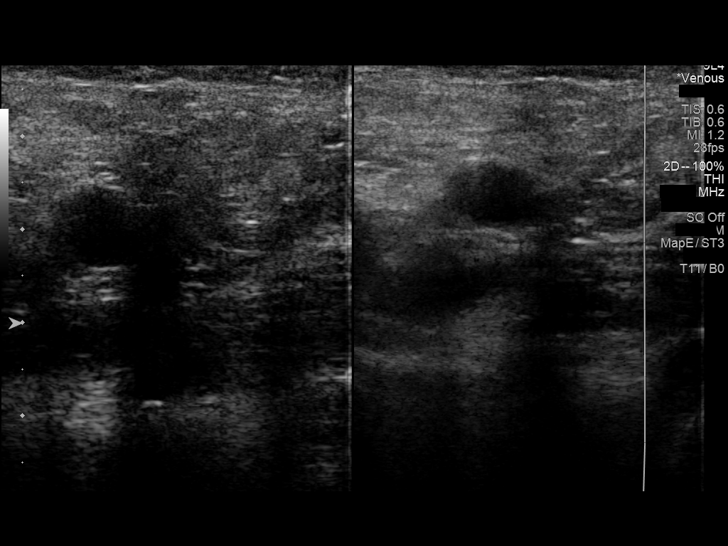
[im 3/29]
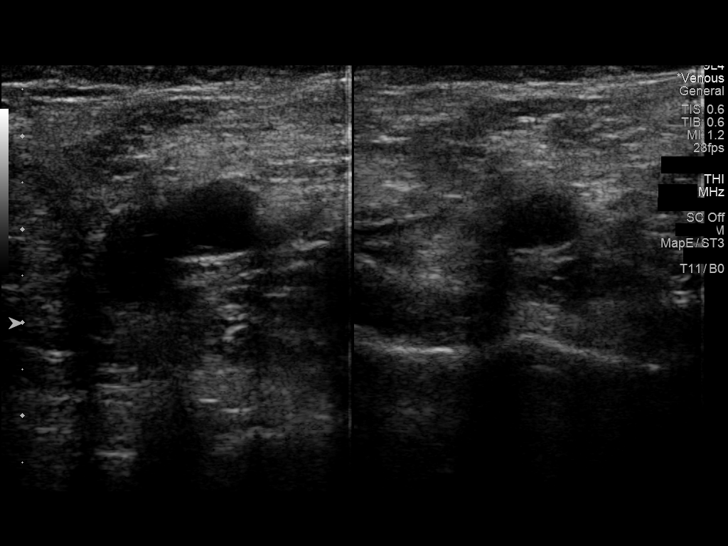
[im 5/29]
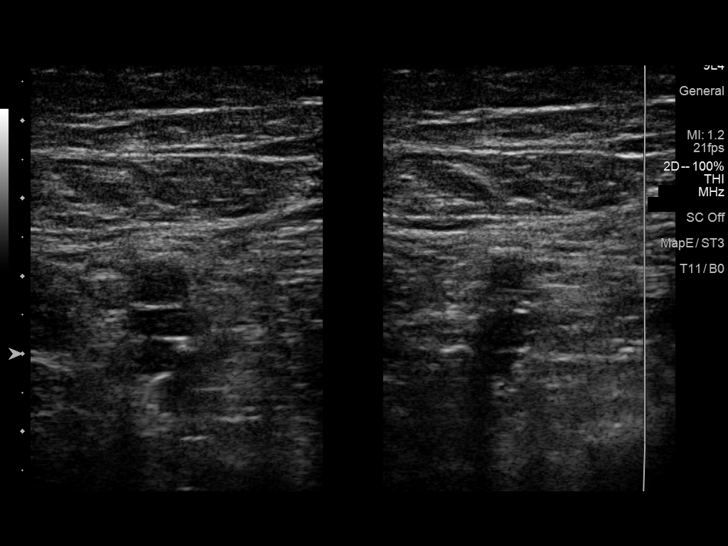
[im 8/29]
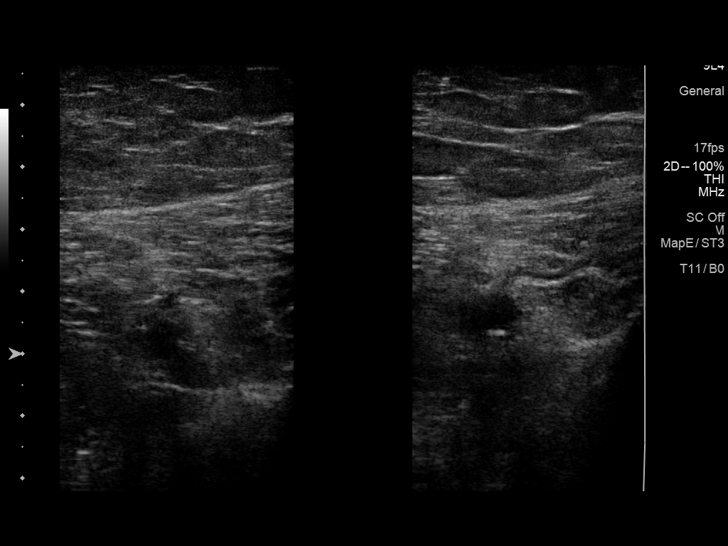
[im 10/29]
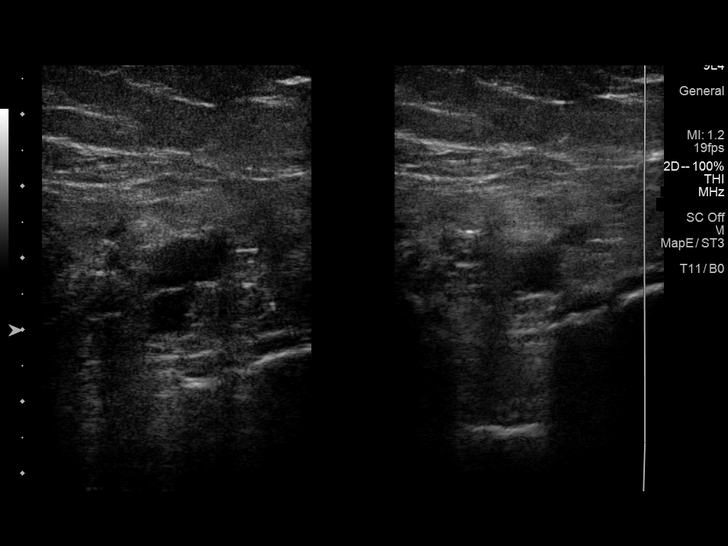
[im 13/29]
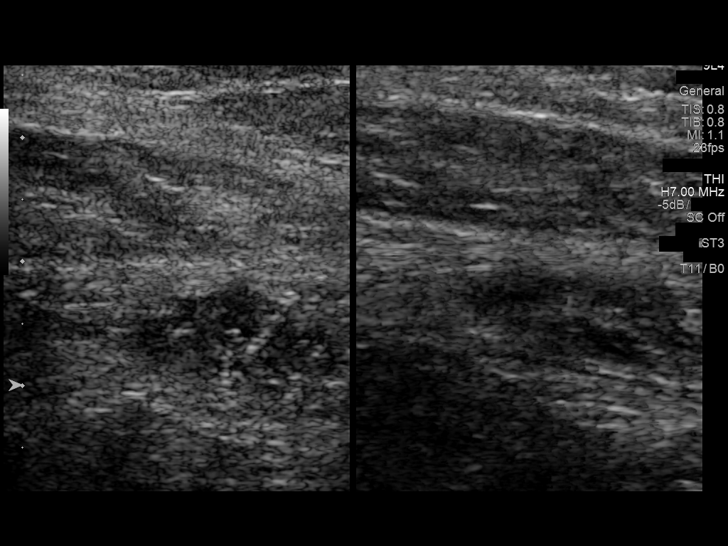
[im 15/29]
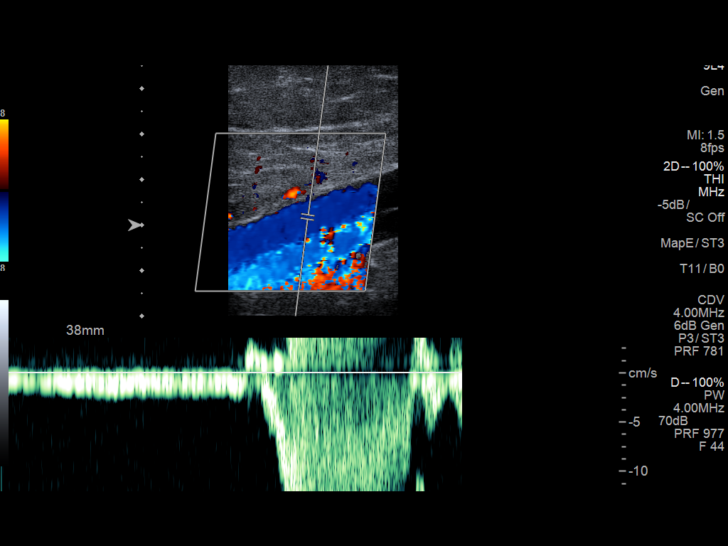
[im 16/29]
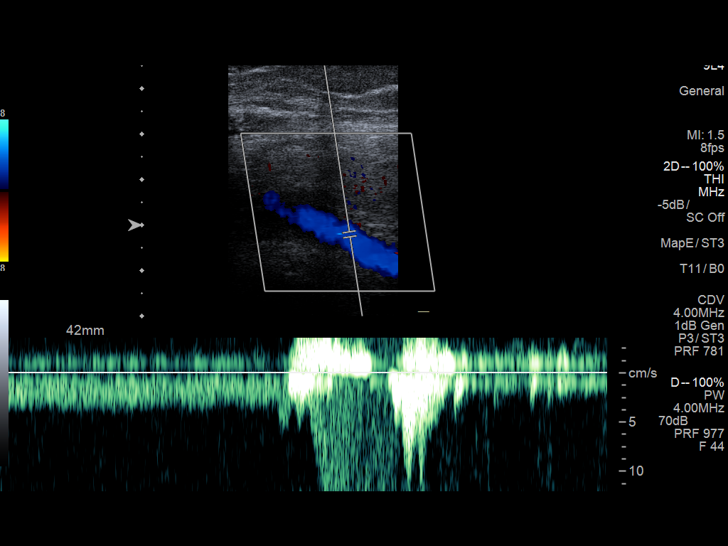
[im 19/29]
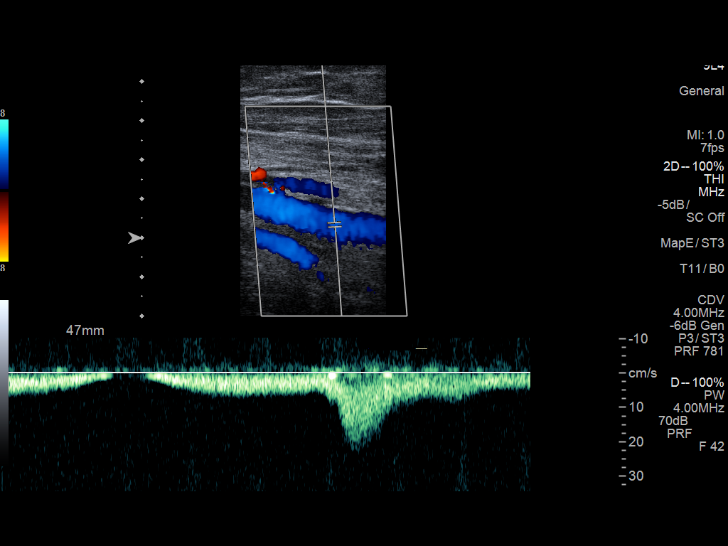
[im 21/29]
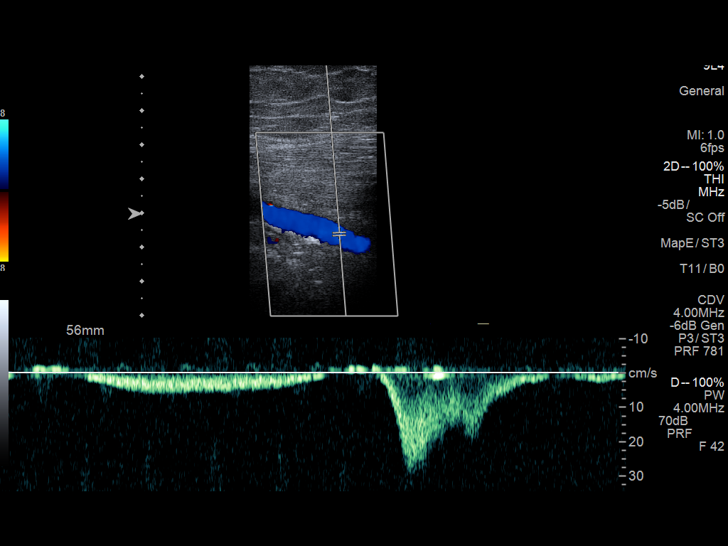
[im 24/29]
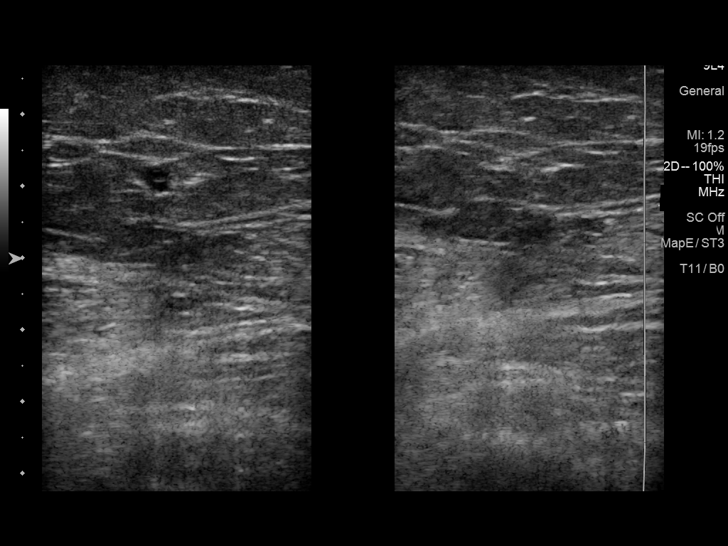
[im 26/29]
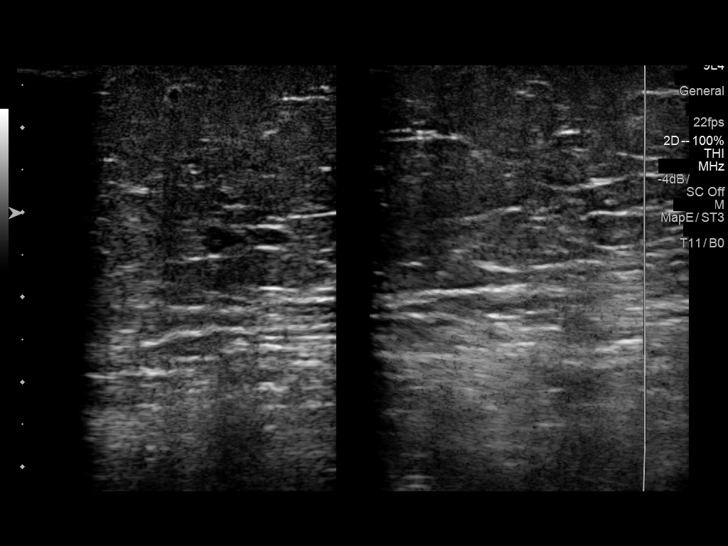
[im 29/29]
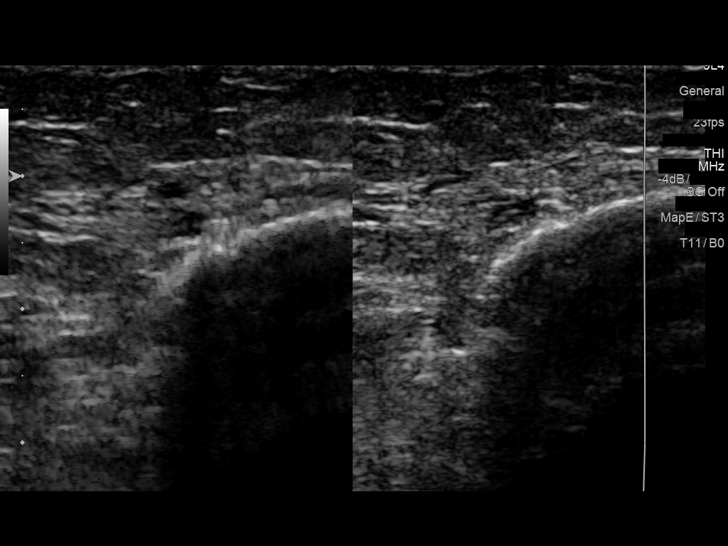

[13 of 24 positions shown; findings below may reference images not displayed]

FINDINGS: Contralateral Common Femoral Vein: Respiratory phasicity is normal
and symmetric with the symptomatic side. No evidence of thrombus.
Normal compressibility.

Common Femoral Vein: No evidence of thrombus. Normal
compressibility, respiratory phasicity and response to augmentation.

Saphenofemoral Junction: No evidence of thrombus. Normal
compressibility and flow on color Doppler imaging.

Profunda Femoral Vein: No evidence of thrombus. Normal
compressibility and flow on color Doppler imaging.

Femoral Vein: No evidence of thrombus. Normal compressibility,
respiratory phasicity and response to augmentation.

Popliteal Vein: No evidence of thrombus. Normal compressibility,
respiratory phasicity and response to augmentation.

Calf Veins: No evidence of thrombus. Normal compressibility and flow
on color Doppler imaging.

Superficial Great Saphenous Vein: No evidence of thrombus. Normal
compressibility and flow on color Doppler imaging.

Other Findings:  None.
IMPRESSION: Sonographic survey of the left lower extremity negative for DVT

## 2019-01-29 DIAGNOSIS — M25512 Pain in left shoulder: Secondary | ICD-10-CM | POA: Diagnosis not present

## 2019-01-30 ENCOUNTER — Other Ambulatory Visit: Payer: Self-pay | Admitting: Family Medicine

## 2019-01-30 ENCOUNTER — Ambulatory Visit
Admission: RE | Admit: 2019-01-30 | Discharge: 2019-01-30 | Disposition: A | Discharge status: Home / Self Care | Payer: 59 | Source: Ambulatory Visit | Attending: Family Medicine | Admitting: Family Medicine

## 2019-01-30 DIAGNOSIS — M79605 Pain in left leg: Secondary | ICD-10-CM

## 2019-01-30 DIAGNOSIS — R140 Abdominal distension (gaseous): Secondary | ICD-10-CM | POA: Diagnosis not present

## 2019-01-30 DIAGNOSIS — K5901 Slow transit constipation: Secondary | ICD-10-CM | POA: Diagnosis not present

## 2019-01-30 DIAGNOSIS — R600 Localized edema: Secondary | ICD-10-CM | POA: Diagnosis not present

## 2019-02-14 DIAGNOSIS — M25562 Pain in left knee: Secondary | ICD-10-CM | POA: Diagnosis not present

## 2019-04-02 DIAGNOSIS — F800 Phonological disorder: Secondary | ICD-10-CM | POA: Diagnosis not present

## 2019-04-02 DIAGNOSIS — M791 Myalgia, unspecified site: Secondary | ICD-10-CM | POA: Diagnosis not present

## 2019-04-02 DIAGNOSIS — R42 Dizziness and giddiness: Secondary | ICD-10-CM | POA: Diagnosis not present

## 2019-04-04 DIAGNOSIS — M791 Myalgia, unspecified site: Secondary | ICD-10-CM | POA: Diagnosis not present

## 2019-04-04 DIAGNOSIS — E559 Vitamin D deficiency, unspecified: Secondary | ICD-10-CM | POA: Diagnosis not present

## 2019-04-04 DIAGNOSIS — R42 Dizziness and giddiness: Secondary | ICD-10-CM | POA: Diagnosis not present

## 2019-04-25 DIAGNOSIS — M25521 Pain in right elbow: Secondary | ICD-10-CM | POA: Diagnosis not present

## 2019-04-25 DIAGNOSIS — M7711 Lateral epicondylitis, right elbow: Secondary | ICD-10-CM | POA: Diagnosis not present

## 2019-04-25 DIAGNOSIS — M7712 Lateral epicondylitis, left elbow: Secondary | ICD-10-CM | POA: Diagnosis not present

## 2019-05-30 ENCOUNTER — Other Ambulatory Visit (HOSPITAL_COMMUNITY)
Admission: RE | Admit: 2019-05-30 | Discharge: 2019-05-30 | Disposition: A | Discharge status: Home / Self Care | Payer: 59 | Source: Ambulatory Visit | Attending: Family Medicine | Admitting: Family Medicine

## 2019-05-30 ENCOUNTER — Other Ambulatory Visit: Payer: Self-pay | Admitting: Family Medicine

## 2019-05-30 DIAGNOSIS — Z0000 Encounter for general adult medical examination without abnormal findings: Secondary | ICD-10-CM | POA: Diagnosis not present

## 2019-06-04 LAB — CYTOLOGY - PAP
Adequacy: ABSENT
Diagnosis: NEGATIVE
HPV: NOT DETECTED

## 2019-10-16 ENCOUNTER — Other Ambulatory Visit: Payer: Self-pay

## 2019-10-16 DIAGNOSIS — M79662 Pain in left lower leg: Secondary | ICD-10-CM

## 2019-10-17 ENCOUNTER — Other Ambulatory Visit: Payer: Self-pay

## 2019-10-17 DIAGNOSIS — M79662 Pain in left lower leg: Secondary | ICD-10-CM

## 2019-10-18 ENCOUNTER — Ambulatory Visit
Admission: RE | Admit: 2019-10-18 | Discharge: 2019-10-18 | Disposition: A | Discharge status: Home / Self Care | Payer: 59 | Source: Ambulatory Visit | Attending: Family Medicine | Admitting: Family Medicine

## 2019-10-18 DIAGNOSIS — M79662 Pain in left lower leg: Secondary | ICD-10-CM

## 2020-07-10 ENCOUNTER — Other Ambulatory Visit: Payer: Self-pay | Admitting: Family Medicine

## 2020-07-10 DIAGNOSIS — N6320 Unspecified lump in the left breast, unspecified quadrant: Secondary | ICD-10-CM

## 2020-08-03 ENCOUNTER — Ambulatory Visit
Admission: RE | Admit: 2020-08-03 | Discharge: 2020-08-03 | Disposition: A | Discharge status: Home / Self Care | Payer: 59 | Source: Ambulatory Visit | Attending: Family Medicine | Admitting: Family Medicine

## 2020-08-03 ENCOUNTER — Other Ambulatory Visit: Payer: Self-pay

## 2020-08-03 DIAGNOSIS — N6320 Unspecified lump in the left breast, unspecified quadrant: Secondary | ICD-10-CM

## 2021-05-26 ENCOUNTER — Other Ambulatory Visit: Payer: Self-pay

## 2021-05-26 ENCOUNTER — Emergency Department (HOSPITAL_BASED_OUTPATIENT_CLINIC_OR_DEPARTMENT_OTHER)
Admission: EM | Admit: 2021-05-26 | Discharge: 2021-05-26 | Disposition: A | Discharge status: Home / Self Care | Payer: 59 | Attending: Emergency Medicine | Admitting: Emergency Medicine

## 2021-05-26 ENCOUNTER — Emergency Department (HOSPITAL_BASED_OUTPATIENT_CLINIC_OR_DEPARTMENT_OTHER): Payer: 59

## 2021-05-26 ENCOUNTER — Encounter (HOSPITAL_BASED_OUTPATIENT_CLINIC_OR_DEPARTMENT_OTHER): Payer: Self-pay | Admitting: Obstetrics and Gynecology

## 2021-05-26 DIAGNOSIS — M79661 Pain in right lower leg: Secondary | ICD-10-CM | POA: Diagnosis present

## 2021-05-26 DIAGNOSIS — Z7901 Long term (current) use of anticoagulants: Secondary | ICD-10-CM | POA: Insufficient documentation

## 2021-05-26 NOTE — ED Triage Notes (Signed)
Patient reports a hx of blood clots and has developed pain in the back of her right leg. Patient is concerned for DVT. Patient states she had not noticed a change in size or heat.

## 2021-05-26 NOTE — Discharge Instructions (Addendum)
You have been seen and discharged from the emergency department.  Your ultrasound was negative for DVT in the lower extremity.  Follow-up with your primary provider for reevaluation and further care. Take home medications as prescribed. If you have any worsening symptoms or further concerns for your health please return to an emergency department for further evaluation.

## 2021-05-26 NOTE — ED Provider Notes (Addendum)
[place] EMERGENCY DEPT Provider Note   CSN: [id number] Arrival date & time: 05/26/21  1223     History Chief Complaint  Patient presents with   Leg Pain    Right leg    Angela Owen is a 50 y.o. female.  HPI  50 year old female with past medical history of DVT/PE that was thought to be provoked by surgery who has completed her 71-month anticoagulation presents the emergency room with right calf aching.  Patient states over the past couple days she has been doing increased physical activity including going up and down a ladder.  She denies any leg swelling, redness or discoloration.  No knee or popliteal fossa pain.  No chest pain or shortness of breath.  No recent fever or illness.  History reviewed. No pertinent past medical history.  Patient Active Problem List   Diagnosis Date Noted   Left leg pain    PE (pulmonary thromboembolism) ([place]) 10/28/2017    Past Surgical History:  Procedure Laterality Date   FOOT SURGERY Left    TOE SURGERY Right      OB History     Gravida  1   Para      Term      Preterm      AB      Living         SAB      IAB      Ectopic      Multiple      Live Births              Family History  Problem Relation Age of Onset   Breast cancer Maternal Aunt    Breast cancer Maternal Aunt     Social History   Tobacco Use   Smoking status: Never   Smokeless tobacco: Never  Substance Use Topics   Alcohol use: Yes    Comment: opcc   Drug use: No    Home Medications Prior to Admission medications   Medication Sig Start Date End Date Taking? Authorizing Provider  venlafaxine (EFFEXOR) 25 MG tablet Take 25 mg by mouth 2 (two) times daily.   Yes [provider]  apixaban (ELIQUIS) 5 MG TABS tablet Take 2 tablets (10 mg total) by mouth 2 (two) times daily for 6 days. 10/30/17 11/05/17  Thurnell Lose, MD  apixaban (ELIQUIS) 5 MG TABS tablet Take 1 tablet (5 mg total) by mouth 2 (two) times  daily. 11/06/17   Thurnell Lose, MD  metaxalone (SKELAXIN) 800 MG tablet Take 800 mg by mouth 2 (two) times daily as needed. 02/21/17   [provider]  ranitidine (ZANTAC) 150 MG tablet Take 150 mg by mouth daily.    [provider]    Allergies    Patient has no known allergies.  Review of Systems   Review of Systems  Constitutional:  Negative for chills and fever.  HENT:  Negative for congestion.   Respiratory:  Negative for chest tightness and shortness of breath.   Cardiovascular:  Negative for chest pain, palpitations and leg swelling.  Gastrointestinal:  Negative for abdominal pain, diarrhea and vomiting.  Musculoskeletal:        + Right calf aching  Skin:  Negative for rash.  Neurological:  Negative for headaches.   Physical Exam Updated Vital Signs BP (!) 144/86 (BP Location: Right Arm)   Pulse 89   Temp 98.6 F (37 C) (Oral)   Resp 15   LMP 03/16/2021 (Approximate)  SpO2 100%   Physical Exam Vitals and nursing note reviewed.  Constitutional:      Appearance: Normal appearance.  HENT:     Head: Normocephalic.     Mouth/Throat:     Mouth: Mucous membranes are moist.  Cardiovascular:     Rate and Rhythm: Normal rate.  Pulmonary:     Effort: Pulmonary effort is normal. No respiratory distress.  Musculoskeletal:     Comments: Right lower extremity looks objectively normal, peripheral pulses intact, no discoloration, slight discomfort when palpating the right calf, no popliteal fossa pain, no swelling  Skin:    General: Skin is warm.  Neurological:     Mental Status: She is alert and oriented to person, place, and time. Mental status is at baseline.  Psychiatric:        Mood and Affect: Mood normal.    ED Results / Procedures / Treatments   Labs (all labs ordered are listed, but only abnormal results are displayed) Labs Reviewed - No data to display  EKG None  Radiology No results found.  Procedures Procedures   Medications  Ordered in ED Medications - No data to display  ED Course  I have reviewed the triage vital signs and the nursing notes.  Pertinent labs & imaging results that were available during my care of the patient were reviewed by me and considered in my medical decision making (see chart for details).    MDM Rules/Calculators/A&P                           50 year old female with past medical history of provoked DVT from surgery presents emergency department right calf pain.  The right lower extremity looks objectively normal, neurovascularly intact.  Vitals are normal.  No chest pain or shortness of breath.  DVT study is negative, she is well-appearing, ambulatory, offers no new complaints.  Patient will be discharged and treated as an outpatient.  Discharge plan and strict return to ED precautions discussed, patient verbalizes understanding and agreement.  Final Clinical Impression(s) / ED Diagnoses Final diagnoses:  None    Rx / DC Orders ED Discharge Orders     None        Lorelle Gibbs, DO 05/26/21 1737    Linzee Depaul, Alvin Critchley, DO 05/26/21 1911

## 2021-05-26 NOTE — ED Notes (Signed)
Pt states that her right leg only hurts when she stands on it and that she is pain free when at rest

## 2021-05-26 NOTE — ED Notes (Signed)
ED Provider at bedside. 

## 2021-07-12 ENCOUNTER — Other Ambulatory Visit (HOSPITAL_COMMUNITY)
Admission: RE | Admit: 2021-07-12 | Discharge: 2021-07-12 | Disposition: A | Discharge status: Home / Self Care | Payer: 59 | Source: Ambulatory Visit | Attending: Family Medicine | Admitting: Family Medicine

## 2021-07-12 DIAGNOSIS — Z124 Encounter for screening for malignant neoplasm of cervix: Secondary | ICD-10-CM | POA: Insufficient documentation

## 2021-07-14 LAB — CYTOLOGY - PAP
Comment: NEGATIVE
Diagnosis: NEGATIVE
High risk HPV: NEGATIVE

## 2021-11-22 ENCOUNTER — Encounter: Payer: Self-pay | Admitting: *Deleted

## 2021-11-24 ENCOUNTER — Ambulatory Visit (INDEPENDENT_AMBULATORY_CARE_PROVIDER_SITE_OTHER): Payer: 59 | Admitting: Psychiatry

## 2021-11-24 ENCOUNTER — Encounter: Payer: Self-pay | Admitting: Psychiatry

## 2021-11-24 VITALS — BP 130/87 | HR 66 | Ht 67.0 in | Wt 232.0 lb

## 2021-11-24 DIAGNOSIS — R4789 Other speech disturbances: Secondary | ICD-10-CM | POA: Diagnosis not present

## 2021-11-24 DIAGNOSIS — M791 Myalgia, unspecified site: Secondary | ICD-10-CM | POA: Diagnosis not present
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
## 2021-11-24 NOTE — Patient Instructions (Addendum)
Blood work - vitamin B12, muscle enzymes Follow up in 6 months

## 2021-11-25 ENCOUNTER — Encounter: Payer: Self-pay | Admitting: Psychiatry

## 2021-11-25 LAB — VITAMIN B12: Vitamin B-12: 949 pg/mL (ref 232–1245)

## 2021-11-25 LAB — CK: Total CK: 77 U/L (ref 32–182)

## 2021-11-25 LAB — ALDOLASE: Aldolase: 5.5 U/L (ref 3.3–10.3)

## 2022-05-25 ENCOUNTER — Ambulatory Visit: Payer: 59 | Admitting: Psychiatry

## 2022-07-12 ENCOUNTER — Encounter: Payer: 59 | Admitting: Obstetrics & Gynecology

## 2022-10-06 ENCOUNTER — Other Ambulatory Visit: Payer: Self-pay | Admitting: Family Medicine

## 2022-10-06 DIAGNOSIS — Z1231 Encounter for screening mammogram for malignant neoplasm of breast: Secondary | ICD-10-CM

## 2022-12-02 ENCOUNTER — Ambulatory Visit
Admission: RE | Admit: 2022-12-02 | Discharge: 2022-12-02 | Disposition: A | Discharge status: Home / Self Care | Payer: 59 | Source: Ambulatory Visit | Attending: Family Medicine | Admitting: Family Medicine

## 2022-12-02 DIAGNOSIS — Z1231 Encounter for screening mammogram for malignant neoplasm of breast: Secondary | ICD-10-CM

## 2022-12-08 ENCOUNTER — Other Ambulatory Visit: Payer: Self-pay | Admitting: Family Medicine

## 2022-12-08 DIAGNOSIS — R928 Other abnormal and inconclusive findings on diagnostic imaging of breast: Secondary | ICD-10-CM

## 2022-12-14 ENCOUNTER — Ambulatory Visit
Admission: RE | Admit: 2022-12-14 | Discharge: 2022-12-14 | Disposition: A | Discharge status: Home / Self Care | Payer: 59 | Source: Ambulatory Visit | Attending: Family Medicine | Admitting: Family Medicine

## 2022-12-14 ENCOUNTER — Other Ambulatory Visit: Payer: Self-pay | Admitting: Family Medicine

## 2022-12-14 DIAGNOSIS — R928 Other abnormal and inconclusive findings on diagnostic imaging of breast: Secondary | ICD-10-CM

## 2022-12-14 DIAGNOSIS — N6310 Unspecified lump in the right breast, unspecified quadrant: Secondary | ICD-10-CM

## 2022-12-19 ENCOUNTER — Ambulatory Visit
Admission: RE | Admit: 2022-12-19 | Discharge: 2022-12-19 | Disposition: A | Discharge status: Home / Self Care | Payer: 59 | Source: Ambulatory Visit | Attending: Family Medicine | Admitting: Family Medicine

## 2022-12-19 ENCOUNTER — Other Ambulatory Visit: Payer: Self-pay | Admitting: Family Medicine

## 2022-12-19 DIAGNOSIS — N6310 Unspecified lump in the right breast, unspecified quadrant: Secondary | ICD-10-CM

## 2022-12-19 DIAGNOSIS — R928 Other abnormal and inconclusive findings on diagnostic imaging of breast: Secondary | ICD-10-CM

## 2022-12-19 HISTORY — PX: BREAST BIOPSY: SHX20

## 2023-04-17 ENCOUNTER — Other Ambulatory Visit: Payer: Self-pay

## 2023-04-17 ENCOUNTER — Emergency Department (HOSPITAL_BASED_OUTPATIENT_CLINIC_OR_DEPARTMENT_OTHER): Payer: 59

## 2023-04-17 ENCOUNTER — Emergency Department (HOSPITAL_BASED_OUTPATIENT_CLINIC_OR_DEPARTMENT_OTHER)
Admission: EM | Admit: 2023-04-17 | Discharge: 2023-04-17 | Disposition: A | Discharge status: Home / Self Care | Payer: 59 | Attending: Emergency Medicine | Admitting: Emergency Medicine

## 2023-04-17 ENCOUNTER — Encounter (HOSPITAL_BASED_OUTPATIENT_CLINIC_OR_DEPARTMENT_OTHER): Payer: Self-pay | Admitting: Emergency Medicine

## 2023-04-17 DIAGNOSIS — R0600 Dyspnea, unspecified: Secondary | ICD-10-CM | POA: Insufficient documentation

## 2023-04-17 DIAGNOSIS — R198 Other specified symptoms and signs involving the digestive system and abdomen: Secondary | ICD-10-CM | POA: Diagnosis not present

## 2023-04-17 DIAGNOSIS — R0602 Shortness of breath: Secondary | ICD-10-CM | POA: Diagnosis present

## 2023-04-17 HISTORY — DX: Other pulmonary embolism without acute cor pulmonale: I26.99

## 2023-04-17 LAB — PREGNANCY, URINE: Preg Test, Ur: NEGATIVE

## 2023-04-17 LAB — COMPREHENSIVE METABOLIC PANEL
ALT: 16 U/L (ref 0–44)
AST: 18 U/L (ref 15–41)
Albumin: 4.4 g/dL (ref 3.5–5.0)
Alkaline Phosphatase: 79 U/L (ref 38–126)
Anion gap: 6 (ref 5–15)
BUN: 15 mg/dL (ref 6–20)
CO2: 29 mmol/L (ref 22–32)
Calcium: 9.3 mg/dL (ref 8.9–10.3)
Chloride: 105 mmol/L (ref 98–111)
Creatinine, Ser: 1.02 mg/dL — ABNORMAL HIGH (ref 0.44–1.00)
GFR, Estimated: >60 mL/min (ref >60)
Glucose, Bld: 76 mg/dL (ref 70–99)
Potassium: 3.8 mmol/L (ref 3.5–5.1)
Sodium: 140 mmol/L (ref 135–145)
Total Bilirubin: 0.4 mg/dL (ref 0.3–1.2)
Total Protein: 7.5 g/dL (ref 6.5–8.1)

## 2023-04-17 LAB — URINALYSIS, ROUTINE W REFLEX MICROSCOPIC
Bilirubin Urine: NEGATIVE
Glucose, UA: NEGATIVE mg/dL
Hgb urine dipstick: NEGATIVE
Ketones, ur: NEGATIVE mg/dL
Leukocytes,Ua: NEGATIVE
Nitrite: NEGATIVE
Protein, ur: NEGATIVE mg/dL
Specific Gravity, Urine: 1.01 (ref 1.005–1.030)
pH: 6 (ref 5.0–8.0)

## 2023-04-17 LAB — CBC
HCT: 45.4 % (ref 36.0–46.0)
Hemoglobin: 14.5 g/dL (ref 12.0–15.0)
MCH: 27.8 pg (ref 26.0–34.0)
MCHC: 31.9 g/dL (ref 30.0–36.0)
MCV: 87.1 fL (ref 80.0–100.0)
Platelets: 257 10*3/uL (ref 150–400)
RBC: 5.21 MIL/uL — ABNORMAL HIGH (ref 3.87–5.11)
RDW: 13.7 % (ref 11.5–15.5)
WBC: 7.9 10*3/uL (ref 4.0–10.5)
nRBC: 0 % (ref 0.0–0.2)

## 2023-04-17 LAB — LIPASE, BLOOD: Lipase: 38 U/L (ref 11–51)

## 2023-04-17 LAB — D-DIMER, QUANTITATIVE: D-Dimer, Quant: 1.11 ug{FEU}/mL — ABNORMAL HIGH (ref 0.00–0.50)

## 2023-04-17 MED ORDER — IOHEXOL 350 MG/ML SOLN
100.0000 mL | Freq: Once | INTRAVENOUS | Status: AC | PRN
  Administered 2023-04-17: 75 mL via INTRAVENOUS

## 2023-04-17 NOTE — ED Triage Notes (Signed)
Pt arrives to ED with c/o RUQ abdominal pain with radiation to her back that started yesterday and SOB that started today. Hx PE.

## 2023-04-17 NOTE — ED Provider Notes (Signed)
[place] EMERGENCY DEPARTMENT AT [facility] Provider Note   CSN: [id number] Arrival date & time: 04/17/23  1108     History  Chief Complaint  Patient presents with   Abdominal Pain   Shortness of Breath    Angela Owen is a 52 y.o. female.  Who presents emergency department with chief complaint of bloating, shortness of breath.  Patient states that she has a history of bloating after eating which she attributes to "a bad gallbladder."  She had her gallbladder removed in 2018 but states that she still has that sensation of bloating after eating with any sized meal.  Today she had eaten breakfast and was on the phone when she had onset of that bloating and then felt like she was having some trouble breathing and "could not get a full breath."  She felt like the discomfort radiated toward her back.  She denies any active pain, pleurisy, fevers, chills, cough, nausea, vomiting.  She states her bowel movements are fairly regular denies any significant constipation.   Abdominal Pain Associated symptoms: shortness of breath   Shortness of Breath Associated symptoms: abdominal pain        Home Medications Prior to Admission medications   Not on File      Allergies    Patient has no known allergies.    Review of Systems   Review of Systems  Respiratory:  Positive for shortness of breath.   Gastrointestinal:  Positive for abdominal pain.    Physical Exam Updated Vital Signs BP (!) 126/97   Pulse 65   Temp 97.9 F (36.6 C) (Oral)   Resp 17   Ht 5\' 7"  (1.702 m)   Wt 99.8 kg   SpO2 100%   BMI 34.46 kg/m  Physical Exam Vitals and nursing note reviewed.  Constitutional:      General: She is not in acute distress.    Appearance: She is well-developed. She is not diaphoretic.  HENT:     Head: Normocephalic and atraumatic.     Right Ear: External ear normal.     Left Ear: External ear normal.     Nose: Nose normal.     Mouth/Throat:     Mouth: Mucous  membranes are moist.  Eyes:     General: No scleral icterus.    Conjunctiva/sclera: Conjunctivae normal.  Cardiovascular:     Rate and Rhythm: Normal rate and regular rhythm.     Heart sounds: Normal heart sounds. No murmur heard.    No friction rub. No gallop.  Pulmonary:     Effort: Pulmonary effort is normal. No respiratory distress.     Breath sounds: Normal breath sounds.  Abdominal:     General: Bowel sounds are normal. There is no distension.     Palpations: Abdomen is soft. There is no mass.     Tenderness: There is no abdominal tenderness. There is no right CVA tenderness, left CVA tenderness or guarding.  Musculoskeletal:     Cervical back: Normal range of motion.  Skin:    General: Skin is warm and dry.  Neurological:     Mental Status: She is alert and oriented to person, place, and time.  Psychiatric:        Behavior: Behavior normal.     ED Results / Procedures / Treatments   Labs (all labs ordered are listed, but only abnormal results are displayed) Labs Reviewed  COMPREHENSIVE METABOLIC PANEL - Abnormal; Notable for the following components:  Result Value   Creatinine, Ser 1.02 (*)    All other components within normal limits  CBC - Abnormal; Notable for the following components:   RBC 5.21 (*)    All other components within normal limits  LIPASE, BLOOD  URINALYSIS, ROUTINE W REFLEX MICROSCOPIC  PREGNANCY, URINE  D-DIMER, QUANTITATIVE    EKG EKG Interpretation  Date/Time:  Monday Apr 17 2023 12:03:56 EDT Ventricular Rate:  60 PR Interval:  187 QRS Duration: 90 QT Interval:  429 QTC Calculation: 429 R Axis:   64 Text Interpretation: Sinus rhythm Low voltage, precordial leads Abnormal R-wave progression, early transition Interpretation limited secondary to artifact Confirmed by Zadie Rhine ([id number]) on 04/17/2023 12:09:35 PM  Radiology No results found.  Procedures Procedures    Medications Ordered in ED Medications - No data to  display  ED Course/ Medical Decision Making/ A&P Clinical Course as of 04/17/23 1540  Mon Apr 17, 2023  1313 DG Chest Port 1 View [AH]  1313 EKG 12-Lead [AH]  1313 D-dimer, quantitative(!) [AH]  1313 CBC(!) [AH]  1313 Pregnancy, urine [AH]  1313 Comprehensive metabolic panel(!) [AH]  1313 Urinalysis, Routine w reflex microscopic -Urine, Clean Catch [AH]  1313 Lipase, blood [AH]    Clinical Course User Index [AH] Arthor Captain, PA-C                             Medical Decision Making This patient presents to the ED for concern of sob and fullness, this involves an extensive number of treatment options, and is a complaint that carries with it a high risk of complications and morbidity.  The emergent differential diagnosis for shortness of breath includes, but is not limited to, Pulmonary edema, bronchoconstriction, Pneumonia, Pulmonary embolism, Pneumotherax/ Hemothorax, Dysrythmia, ACS.     Co morbidities:      Status post cholecystectomy, history of PE  Social Determinants of Health:       Does not accept blood products.  Additional history:  {Additional history obtained from EMR   Lab Tests:  I Ordered, and personally interpreted labs.  The pertinent results include: As per the ED course.  Patient's labs are otherwise unremarkable except for elevated D-dimer of 1.11  Imaging Studies:  I ordered imaging studies including CT angio chest PE study and 1 view chest x-ray I independently visualized and interpreted imaging which showed no acute findings I agree with the radiologist interpretation  Cardiac Monitoring/ECG:       The patient was maintained on a cardiac monitor.  I personally viewed and interpreted the cardiac monitored which showed an underlying rhythm of: Normal sinus rhythm at a rate of 60  Medicines ordered and prescription drug management:   Test Considered:       Right upper quadrant ultrasound however patient is status post  cholecystectom  Critical Interventions:         Consultations Obtained:   Problem List / ED Course:       (R06.00) Dyspnea, unspecified type  (primary encounter diagnosis)  (R19.8) Abdominal fullness   MDM: Patient here with complaint of shortness of breath and sensation of fullness which is chronic after eating.  No acute findings noted on exam today.  She does not have any evidence of pneumonia, PE.  Will have the patient follow-up with GI specialist.  She appears otherwise appropriate for discharge at this time.   Dispostion:  After consideration of the diagnostic results and the patients response to  treatment, I feel that the patent would benefit from discharge with outpatient follow-up.    Amount and/or Complexity of Data Reviewed Labs: ordered. Decision-making details documented in ED Course. Radiology: ordered. Decision-making details documented in ED Course. ECG/medicine tests:  Decision-making details documented in ED Course.  Risk Prescription drug management.           Final Clinical Impression(s) / ED Diagnoses Final diagnoses:  None    Rx / DC Orders ED Discharge Orders     None         Arthor Captain, PA-C 04/17/23 1544    Zadie Rhine, MD 04/20/23 [phone]

## 2023-04-17 NOTE — Discharge Instructions (Signed)
Contact a health care provider if: Your condition does not improve as soon as expected. You have a hard time doing your normal activities, even after you rest. You have new symptoms. You cannot walk up stairs or exercise the way that you normally do. Get help right away if: Your shortness of breath gets worse. You have shortness of breath when you are resting. You feel light-headed or you faint. You have a cough that is not controlled with medicines. You cough up blood. You have pain with breathing. You have pain in your chest, arms, shoulders, or abdomen. You have a fever. These symptoms may be an emergency. Get help right away. Call 911. Do not wait to see if the symptoms will go away. Do not drive yourself to the hospital. 

## 2023-04-17 NOTE — ED Notes (Signed)
Patient verbalizes understanding of discharge instructions. Opportunity for questioning and answers were provided. Patient discharged from ED.  °

## 2023-10-24 ENCOUNTER — Other Ambulatory Visit: Payer: Self-pay | Admitting: Family Medicine

## 2023-10-24 DIAGNOSIS — N63 Unspecified lump in unspecified breast: Secondary | ICD-10-CM

## 2023-12-04 ENCOUNTER — Ambulatory Visit: Admission: RE | Admit: 2023-12-04 | Payer: 59 | Source: Ambulatory Visit

## 2023-12-04 ENCOUNTER — Ambulatory Visit
Admission: RE | Admit: 2023-12-04 | Discharge: 2023-12-04 | Disposition: A | Discharge status: Home / Self Care | Payer: 59 | Source: Ambulatory Visit | Attending: Family Medicine | Admitting: Family Medicine

## 2023-12-04 DIAGNOSIS — N63 Unspecified lump in unspecified breast: Secondary | ICD-10-CM

## 2023-12-12 ENCOUNTER — Other Ambulatory Visit: Payer: Self-pay | Admitting: Family Medicine

## 2023-12-12 DIAGNOSIS — Z006 Encounter for examination for normal comparison and control in clinical research program: Secondary | ICD-10-CM

## 2023-12-20 ENCOUNTER — Other Ambulatory Visit: Payer: Self-pay | Admitting: Family Medicine

## 2023-12-20 DIAGNOSIS — Z006 Encounter for examination for normal comparison and control in clinical research program: Secondary | ICD-10-CM

## 2024-01-17 ENCOUNTER — Other Ambulatory Visit: Payer: 59

## 2024-09-11 ENCOUNTER — Encounter: Admitting: Radiology

## 2024-10-01 ENCOUNTER — Other Ambulatory Visit: Payer: Self-pay | Admitting: Obstetrics and Gynecology

## 2024-10-01 DIAGNOSIS — Z8249 Family history of ischemic heart disease and other diseases of the circulatory system: Secondary | ICD-10-CM

## 2024-10-07 ENCOUNTER — Ambulatory Visit
Admission: RE | Admit: 2024-10-07 | Discharge: 2024-10-07 | Disposition: A | Discharge status: Home / Self Care | Source: Ambulatory Visit | Attending: Obstetrics and Gynecology | Admitting: Obstetrics and Gynecology

## 2024-10-07 DIAGNOSIS — Z8249 Family history of ischemic heart disease and other diseases of the circulatory system: Secondary | ICD-10-CM
